# Patient Record
Sex: Male | Born: 1940 | Race: Black or African American | Hispanic: No | Marital: Married | Smoking: Former smoker
Health system: Southern US, Community
[De-identification: ages and names within clinical notes are randomized; demographics above are authoritative.]

## PROBLEM LIST (undated history)

## (undated) DIAGNOSIS — I255 Ischemic cardiomyopathy: Secondary | ICD-10-CM

## (undated) DIAGNOSIS — N189 Chronic kidney disease, unspecified: Secondary | ICD-10-CM

## (undated) DIAGNOSIS — E785 Hyperlipidemia, unspecified: Secondary | ICD-10-CM

## (undated) DIAGNOSIS — I2510 Atherosclerotic heart disease of native coronary artery without angina pectoris: Secondary | ICD-10-CM

## (undated) DIAGNOSIS — I10 Essential (primary) hypertension: Secondary | ICD-10-CM

## (undated) DIAGNOSIS — C801 Malignant (primary) neoplasm, unspecified: Secondary | ICD-10-CM

## (undated) HISTORY — DX: Hyperlipidemia, unspecified: E78.5

## (undated) HISTORY — DX: Ischemic cardiomyopathy: I25.5

## (undated) HISTORY — PX: DG ANGIO AV SHUNT*R*: HXRAD909

## (undated) HISTORY — DX: Atherosclerotic heart disease of native coronary artery without angina pectoris: I25.10

## (undated) HISTORY — PX: DIALYSIS FISTULA CREATION: SHX611

## (undated) HISTORY — DX: Essential (primary) hypertension: I10

## (undated) HISTORY — PX: OTHER SURGICAL HISTORY: SHX169

## (undated) HISTORY — PX: MITRAL VALVE REPAIR: SHX2039

## (undated) HISTORY — PX: SP AV DIALYSIS SHUNT INTRO NEEDLE *R*: HXRAD810

## (undated) HISTORY — DX: Chronic kidney disease, unspecified: N18.9

## (undated) HISTORY — DX: Malignant (primary) neoplasm, unspecified: C80.1

---

## 2000-06-09 ENCOUNTER — Other Ambulatory Visit: Admission: RE | Admit: 2000-06-09 | Discharge: 2000-06-09 | Payer: Self-pay | Admitting: Urology

## 2000-06-23 ENCOUNTER — Encounter: Admission: RE | Admit: 2000-06-23 | Discharge: 2000-06-23 | Payer: Self-pay | Admitting: Urology

## 2000-06-23 ENCOUNTER — Encounter: Payer: Self-pay | Admitting: Urology

## 2000-06-29 ENCOUNTER — Encounter: Admission: RE | Admit: 2000-06-29 | Discharge: 2000-09-27 | Payer: Self-pay | Admitting: *Deleted

## 2000-08-11 ENCOUNTER — Ambulatory Visit (HOSPITAL_BASED_OUTPATIENT_CLINIC_OR_DEPARTMENT_OTHER): Admission: RE | Admit: 2000-08-11 | Discharge: 2000-08-11 | Payer: Self-pay | Admitting: Urology

## 2000-08-11 ENCOUNTER — Encounter: Payer: Self-pay | Admitting: Urology

## 2001-01-03 ENCOUNTER — Ambulatory Visit: Admission: RE | Admit: 2001-01-03 | Discharge: 2001-04-03 | Payer: Self-pay | Admitting: *Deleted

## 2002-05-29 ENCOUNTER — Encounter: Payer: Self-pay | Admitting: Orthopedic Surgery

## 2002-06-01 ENCOUNTER — Ambulatory Visit (HOSPITAL_COMMUNITY): Admission: RE | Admit: 2002-06-01 | Discharge: 2002-06-01 | Payer: Self-pay | Admitting: Orthopedic Surgery

## 2002-08-02 ENCOUNTER — Encounter: Admission: RE | Admit: 2002-08-02 | Discharge: 2002-08-02 | Payer: Self-pay | Admitting: Nephrology

## 2002-08-02 ENCOUNTER — Encounter: Payer: Self-pay | Admitting: Nephrology

## 2003-08-16 ENCOUNTER — Inpatient Hospital Stay (HOSPITAL_COMMUNITY): Admission: EM | Admit: 2003-08-16 | Discharge: 2003-08-20 | Payer: Self-pay | Admitting: *Deleted

## 2003-08-19 ENCOUNTER — Encounter: Payer: Self-pay | Admitting: *Deleted

## 2003-08-19 ENCOUNTER — Encounter: Payer: Self-pay | Admitting: Internal Medicine

## 2004-05-24 HISTORY — PX: CORONARY ARTERY BYPASS GRAFT: SHX141

## 2004-08-19 ENCOUNTER — Ambulatory Visit: Payer: Self-pay | Admitting: Cardiology

## 2004-08-20 ENCOUNTER — Ambulatory Visit: Payer: Self-pay

## 2004-08-20 ENCOUNTER — Ambulatory Visit: Payer: Self-pay | Admitting: Cardiology

## 2004-08-21 ENCOUNTER — Ambulatory Visit: Payer: Self-pay

## 2004-08-24 ENCOUNTER — Inpatient Hospital Stay (HOSPITAL_COMMUNITY): Admission: AD | Admit: 2004-08-24 | Discharge: 2004-09-06 | Payer: Self-pay | Admitting: Cardiology

## 2004-08-24 ENCOUNTER — Ambulatory Visit: Payer: Self-pay | Admitting: Cardiology

## 2004-09-07 ENCOUNTER — Ambulatory Visit: Payer: Self-pay | Admitting: Cardiology

## 2004-09-14 ENCOUNTER — Ambulatory Visit: Payer: Self-pay | Admitting: Cardiology

## 2004-09-21 ENCOUNTER — Ambulatory Visit: Payer: Self-pay | Admitting: Cardiology

## 2004-09-28 ENCOUNTER — Ambulatory Visit: Payer: Self-pay | Admitting: *Deleted

## 2004-10-02 ENCOUNTER — Encounter: Admission: RE | Admit: 2004-10-02 | Discharge: 2004-10-02 | Payer: Self-pay | Admitting: Cardiothoracic Surgery

## 2004-10-12 ENCOUNTER — Ambulatory Visit: Payer: Self-pay | Admitting: Cardiology

## 2004-10-26 ENCOUNTER — Ambulatory Visit: Payer: Self-pay | Admitting: *Deleted

## 2004-10-27 ENCOUNTER — Inpatient Hospital Stay (HOSPITAL_COMMUNITY): Admission: EM | Admit: 2004-10-27 | Discharge: 2004-10-30 | Payer: Self-pay | Admitting: Emergency Medicine

## 2004-10-28 ENCOUNTER — Encounter: Payer: Self-pay | Admitting: Cardiovascular Disease

## 2004-10-28 ENCOUNTER — Ambulatory Visit: Payer: Self-pay | Admitting: Cardiovascular Disease

## 2004-11-05 ENCOUNTER — Inpatient Hospital Stay (HOSPITAL_COMMUNITY): Admission: RE | Admit: 2004-11-05 | Discharge: 2004-11-06 | Payer: Self-pay | Admitting: Internal Medicine

## 2004-11-11 ENCOUNTER — Ambulatory Visit: Payer: Self-pay | Admitting: Cardiology

## 2004-11-19 ENCOUNTER — Ambulatory Visit: Payer: Self-pay | Admitting: Cardiology

## 2004-11-23 ENCOUNTER — Ambulatory Visit: Payer: Self-pay | Admitting: Cardiology

## 2004-11-25 ENCOUNTER — Ambulatory Visit: Payer: Self-pay

## 2004-12-14 ENCOUNTER — Ambulatory Visit: Payer: Self-pay | Admitting: Cardiology

## 2004-12-17 ENCOUNTER — Ambulatory Visit: Payer: Self-pay | Admitting: Internal Medicine

## 2005-01-11 ENCOUNTER — Ambulatory Visit: Payer: Self-pay | Admitting: Cardiology

## 2005-02-08 ENCOUNTER — Ambulatory Visit: Payer: Self-pay | Admitting: Cardiology

## 2005-02-23 ENCOUNTER — Ambulatory Visit: Payer: Self-pay | Admitting: Internal Medicine

## 2005-03-16 ENCOUNTER — Ambulatory Visit: Payer: Self-pay | Admitting: Cardiology

## 2005-03-22 ENCOUNTER — Ambulatory Visit: Payer: Self-pay

## 2005-03-30 ENCOUNTER — Ambulatory Visit: Payer: Self-pay | Admitting: Cardiology

## 2005-04-06 ENCOUNTER — Ambulatory Visit: Payer: Self-pay | Admitting: Internal Medicine

## 2005-04-24 IMAGING — CR DG CHEST 1V PORT
1 series · 1 of 1 positions shown · non-contrast
Comparison: 05/29/02.

CLINICAL DATA: Chest pain and shortness of breath.
 PORTABLE CHEST, ONE VIEW 08/16/03 AT [DATE] HOURS

[view not recorded]
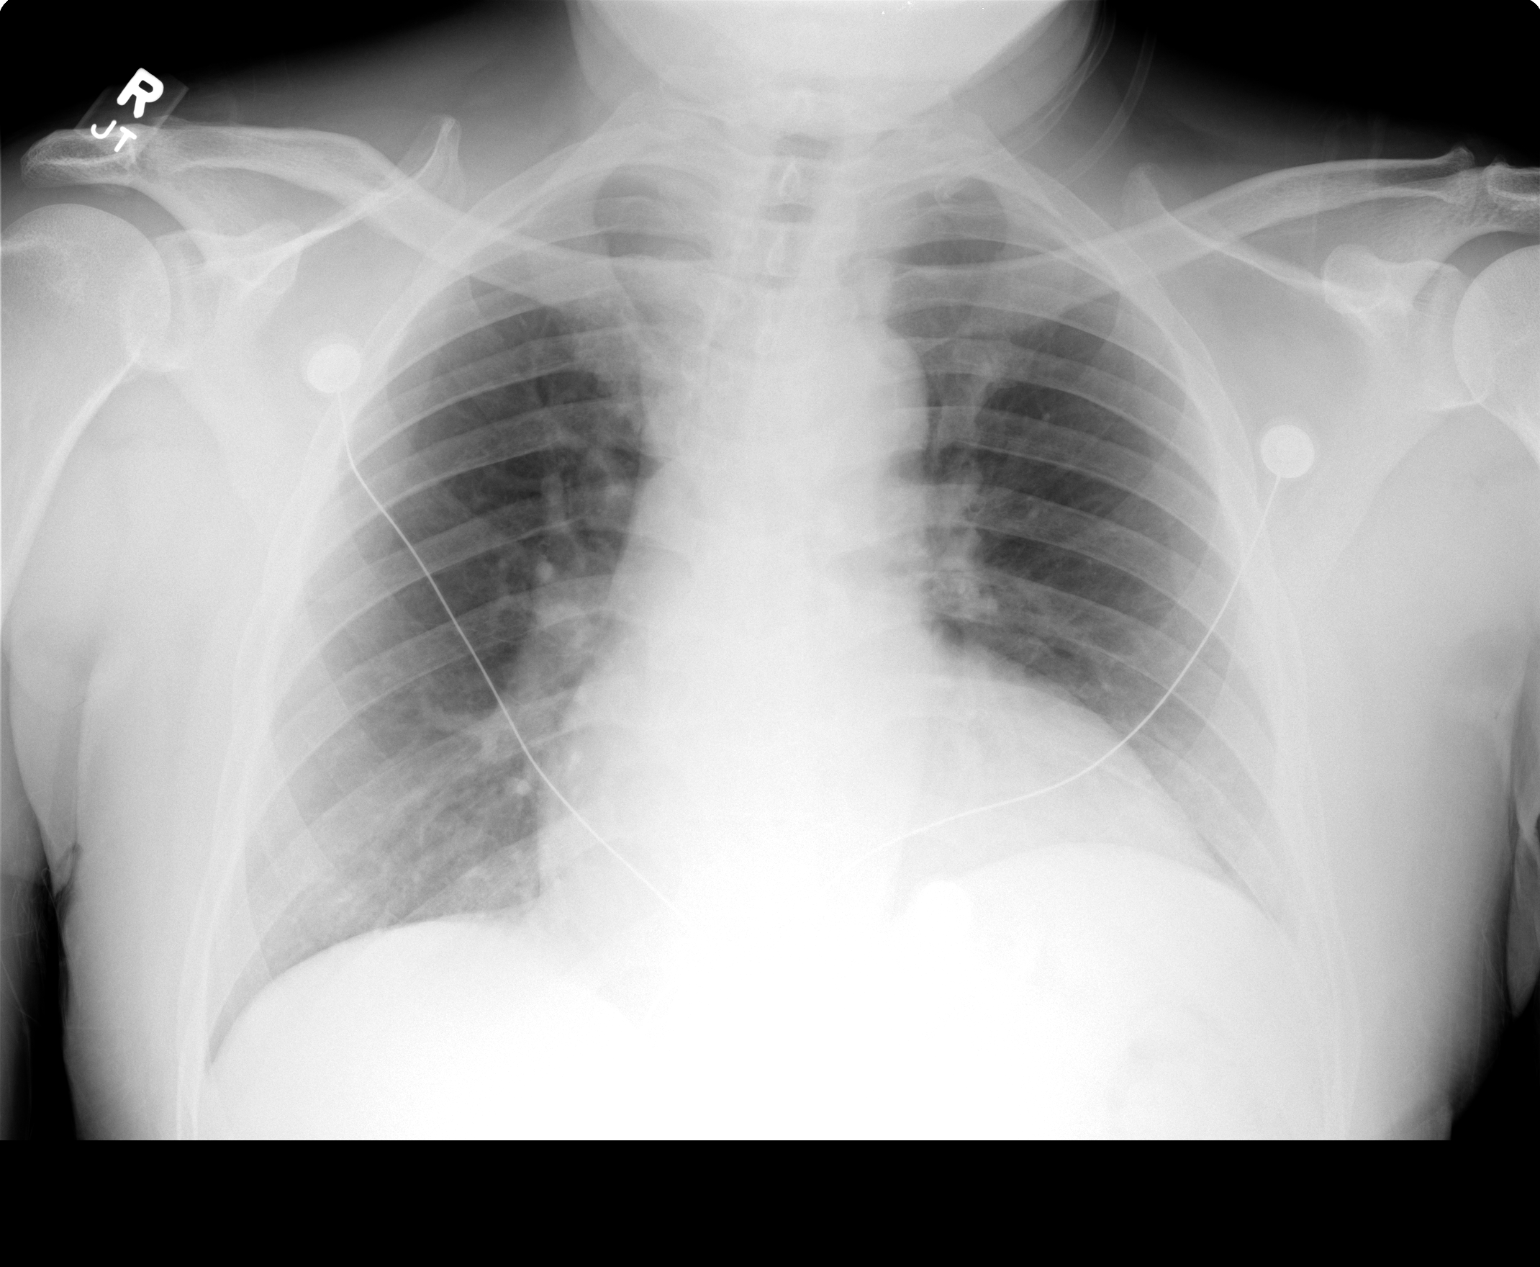

[1 of 1 positions shown; findings below may reference images not displayed]

Low inspiratory lung volumes are seen.  Heart size is within normal limits allowing for portable technique.  Both the lungs are clear.  
 IMPRESSION
 No active disease.

## 2005-06-01 ENCOUNTER — Ambulatory Visit: Payer: Self-pay | Admitting: Internal Medicine

## 2005-11-04 ENCOUNTER — Ambulatory Visit: Payer: Self-pay | Admitting: Cardiology

## 2005-11-16 ENCOUNTER — Encounter: Payer: Self-pay | Admitting: Cardiology

## 2005-11-16 ENCOUNTER — Ambulatory Visit: Payer: Self-pay

## 2005-11-29 ENCOUNTER — Ambulatory Visit (HOSPITAL_BASED_OUTPATIENT_CLINIC_OR_DEPARTMENT_OTHER): Admission: RE | Admit: 2005-11-29 | Discharge: 2005-11-29 | Payer: Self-pay | Admitting: Cardiology

## 2005-12-17 ENCOUNTER — Ambulatory Visit: Payer: Self-pay | Admitting: Pulmonary Disease

## 2006-02-01 ENCOUNTER — Ambulatory Visit: Payer: Self-pay | Admitting: Pulmonary Disease

## 2006-03-14 ENCOUNTER — Ambulatory Visit: Payer: Self-pay | Admitting: Pulmonary Disease

## 2006-05-10 ENCOUNTER — Ambulatory Visit: Payer: Self-pay | Admitting: Internal Medicine

## 2006-05-10 ENCOUNTER — Ambulatory Visit: Payer: Self-pay | Admitting: Cardiology

## 2006-06-11 IMAGING — CR DG CHEST 2V
2 series · 2 of 2 positions shown · non-contrast
Comparison: none

CLINICAL DATA: Post CABG.  Mitral regurgitation.  
 CHEST, TWO VIEWS: 
 Since [REDACTED] chest x-ray 09/04/04 there is stable moderate cardiomegaly post CABG with greater aeration at the lung bases with minimal left and small right pleural effusion with slight residual right basilar atelectasis.

[view not recorded (1 of 2)]
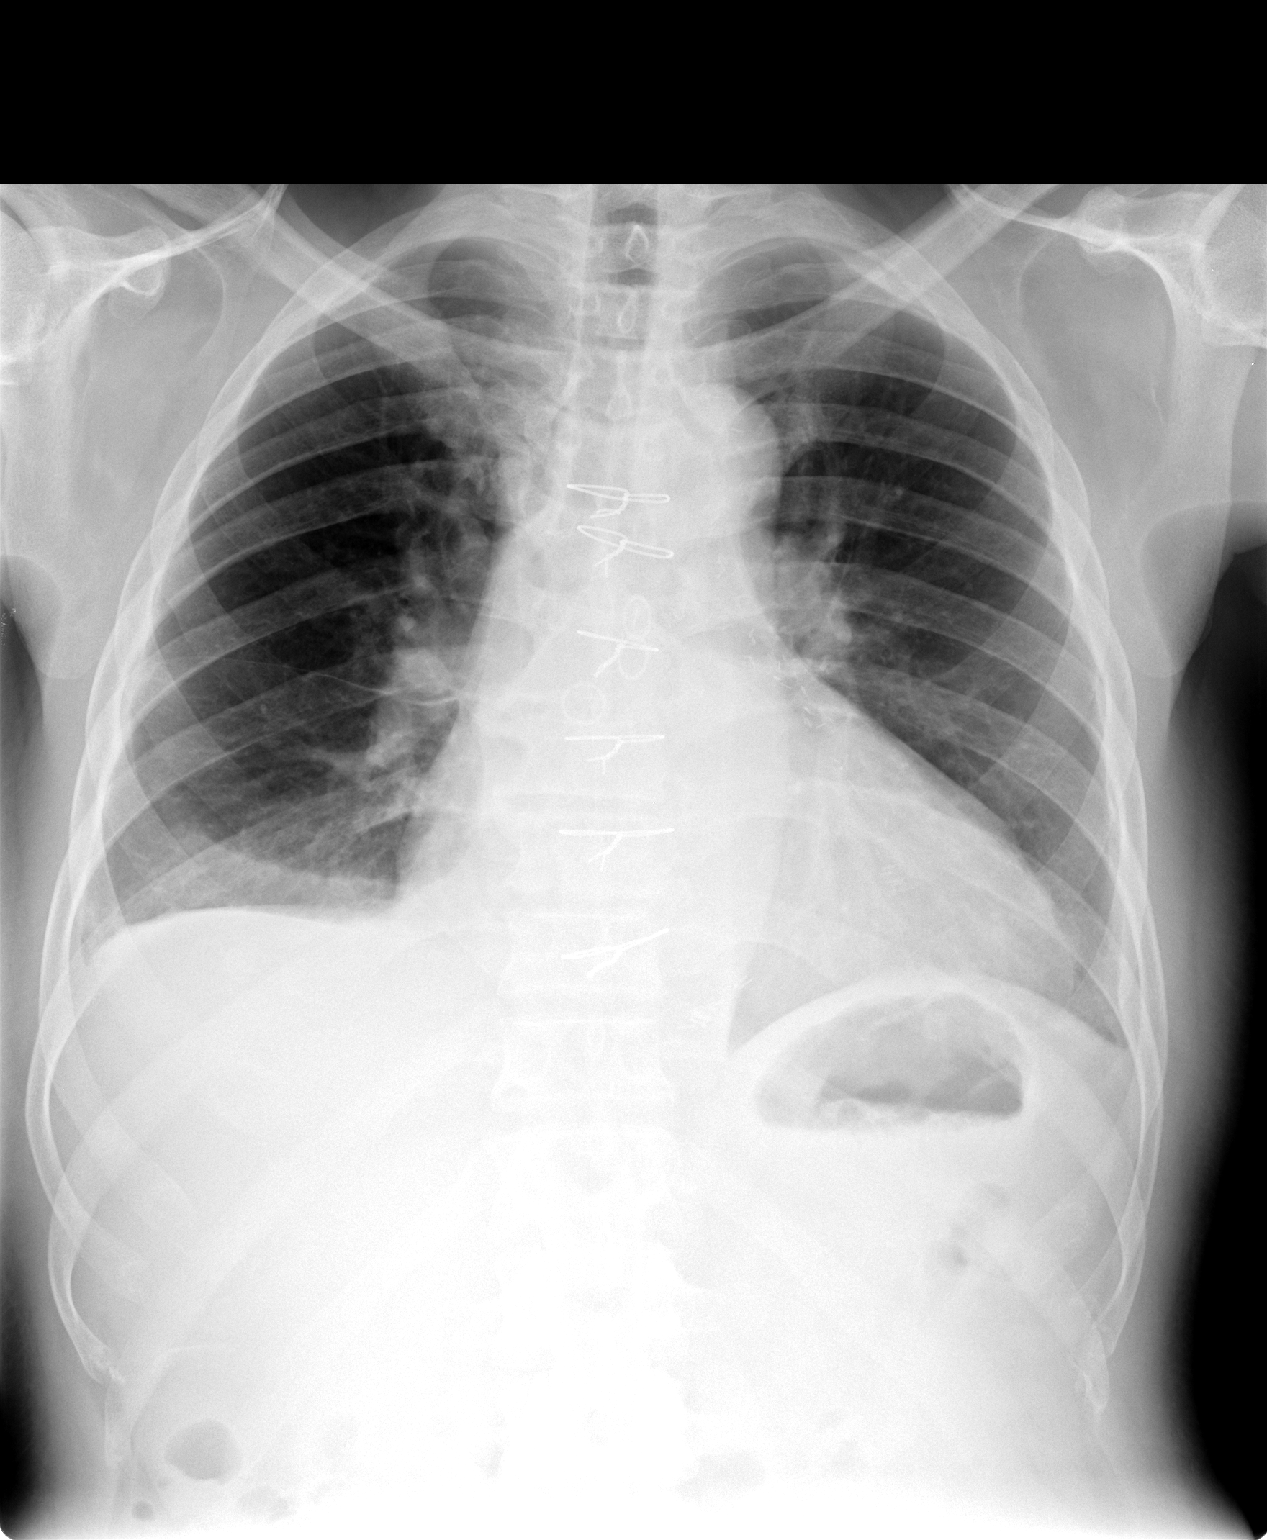

[view not recorded (2 of 2)]
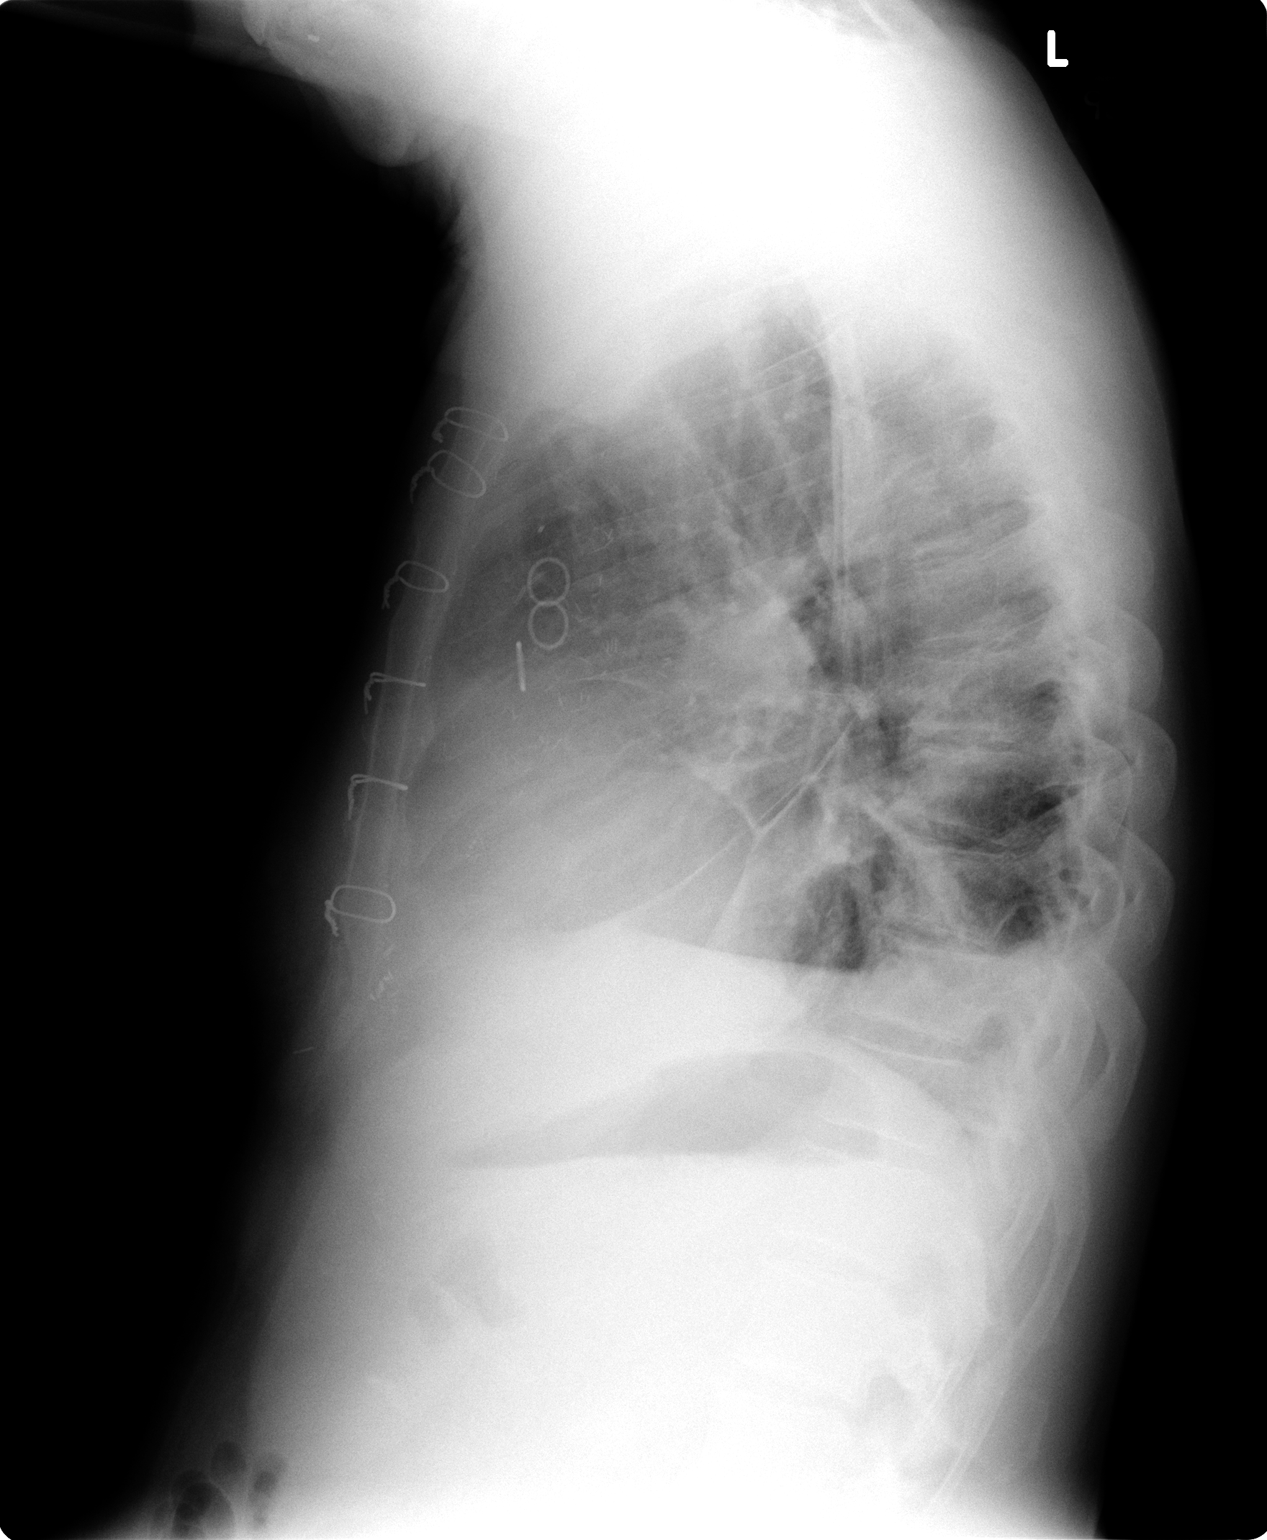

[2 of 2 positions shown; findings below may reference images not displayed]

IMPRESSION: Since [REDACTED] chest x-ray 09/04/04: 
 1.  Regressing small right greater than left pleural effusions with slight right basilar atelectasis. 
 2.  Stable moderate cardiomegaly post CABG.  
 3.  Otherwise no active disease.

## 2006-11-14 ENCOUNTER — Ambulatory Visit: Payer: Self-pay | Admitting: Cardiology

## 2007-03-13 ENCOUNTER — Ambulatory Visit: Payer: Self-pay | Admitting: Internal Medicine

## 2007-09-14 ENCOUNTER — Ambulatory Visit: Payer: Self-pay | Admitting: Cardiology

## 2007-12-08 ENCOUNTER — Ambulatory Visit: Payer: Self-pay | Admitting: Vascular Surgery

## 2007-12-15 ENCOUNTER — Ambulatory Visit: Payer: Self-pay | Admitting: Surgery

## 2007-12-15 ENCOUNTER — Ambulatory Visit (HOSPITAL_COMMUNITY): Admission: RE | Admit: 2007-12-15 | Discharge: 2007-12-15 | Payer: Self-pay | Admitting: Surgery

## 2007-12-24 ENCOUNTER — Ambulatory Visit: Payer: Self-pay | Admitting: Cardiovascular Disease

## 2007-12-25 ENCOUNTER — Encounter: Payer: Self-pay | Admitting: Cardiovascular Disease

## 2007-12-25 ENCOUNTER — Inpatient Hospital Stay (HOSPITAL_COMMUNITY): Admission: EM | Admit: 2007-12-25 | Discharge: 2008-01-02 | Payer: Self-pay | Admitting: Emergency Medicine

## 2007-12-28 ENCOUNTER — Encounter: Payer: Self-pay | Admitting: Cardiology

## 2008-01-15 ENCOUNTER — Ambulatory Visit: Payer: Self-pay | Admitting: Surgery

## 2008-01-17 ENCOUNTER — Ambulatory Visit (HOSPITAL_COMMUNITY): Admission: RE | Admit: 2008-01-17 | Discharge: 2008-01-17 | Payer: Self-pay | Admitting: Surgery

## 2008-01-17 ENCOUNTER — Ambulatory Visit: Payer: Self-pay | Admitting: Vascular Surgery

## 2008-02-05 ENCOUNTER — Ambulatory Visit: Payer: Self-pay | Admitting: Cardiology

## 2008-02-26 ENCOUNTER — Ambulatory Visit: Payer: Self-pay | Admitting: Surgery

## 2008-05-06 ENCOUNTER — Ambulatory Visit: Payer: Self-pay | Admitting: Cardiology

## 2008-05-10 ENCOUNTER — Ambulatory Visit (HOSPITAL_COMMUNITY): Admission: RE | Admit: 2008-05-10 | Discharge: 2008-05-10 | Payer: Self-pay | Admitting: Nephrology

## 2008-05-20 ENCOUNTER — Ambulatory Visit: Payer: Self-pay | Admitting: Surgery

## 2008-05-31 ENCOUNTER — Ambulatory Visit (HOSPITAL_COMMUNITY): Admission: RE | Admit: 2008-05-31 | Discharge: 2008-05-31 | Payer: Self-pay | Admitting: Surgery

## 2008-05-31 ENCOUNTER — Ambulatory Visit: Payer: Self-pay | Admitting: Surgery

## 2008-06-27 ENCOUNTER — Encounter: Payer: Self-pay | Admitting: Internal Medicine

## 2008-07-15 ENCOUNTER — Ambulatory Visit: Payer: Self-pay | Admitting: Surgery

## 2008-08-12 ENCOUNTER — Ambulatory Visit: Payer: Self-pay | Admitting: *Deleted

## 2008-08-12 ENCOUNTER — Ambulatory Visit (HOSPITAL_COMMUNITY): Admission: RE | Admit: 2008-08-12 | Discharge: 2008-08-12 | Payer: Self-pay | Admitting: Vascular Surgery

## 2008-08-30 DIAGNOSIS — M109 Gout, unspecified: Secondary | ICD-10-CM

## 2008-08-30 DIAGNOSIS — Z8546 Personal history of malignant neoplasm of prostate: Secondary | ICD-10-CM

## 2008-08-30 DIAGNOSIS — Z95810 Presence of automatic (implantable) cardiac defibrillator: Secondary | ICD-10-CM | POA: Insufficient documentation

## 2008-08-30 DIAGNOSIS — I25810 Atherosclerosis of coronary artery bypass graft(s) without angina pectoris: Secondary | ICD-10-CM

## 2008-08-30 DIAGNOSIS — I10 Essential (primary) hypertension: Secondary | ICD-10-CM

## 2008-09-02 ENCOUNTER — Ambulatory Visit: Payer: Self-pay | Admitting: Internal Medicine

## 2008-09-02 ENCOUNTER — Encounter: Payer: Self-pay | Admitting: Internal Medicine

## 2009-04-28 ENCOUNTER — Encounter (INDEPENDENT_AMBULATORY_CARE_PROVIDER_SITE_OTHER): Payer: Self-pay | Admitting: *Deleted

## 2009-06-05 ENCOUNTER — Telehealth (INDEPENDENT_AMBULATORY_CARE_PROVIDER_SITE_OTHER): Payer: Self-pay | Admitting: *Deleted

## 2009-07-02 ENCOUNTER — Telehealth (INDEPENDENT_AMBULATORY_CARE_PROVIDER_SITE_OTHER): Payer: Self-pay

## 2009-07-03 ENCOUNTER — Ambulatory Visit: Payer: Self-pay | Admitting: Cardiology

## 2009-07-03 ENCOUNTER — Ambulatory Visit (HOSPITAL_COMMUNITY): Admission: RE | Admit: 2009-07-03 | Discharge: 2009-07-03 | Payer: Self-pay | Admitting: Surgery

## 2009-07-03 ENCOUNTER — Ambulatory Visit: Payer: Self-pay

## 2009-07-03 ENCOUNTER — Encounter: Payer: Self-pay | Admitting: Cardiology

## 2009-07-16 ENCOUNTER — Encounter: Payer: Self-pay | Admitting: Cardiology

## 2009-07-17 ENCOUNTER — Encounter: Payer: Self-pay | Admitting: Internal Medicine

## 2009-07-17 ENCOUNTER — Ambulatory Visit: Payer: Self-pay | Admitting: Cardiology

## 2009-09-01 IMAGING — CR DG CHEST 1V PORT
1 series · 1 of 1 positions shown · non-contrast
Comparison: 12/15/2007

CLINICAL DATA: Cough, short of breath, rales

PORTABLE CHEST - 1 VIEW

[AP]
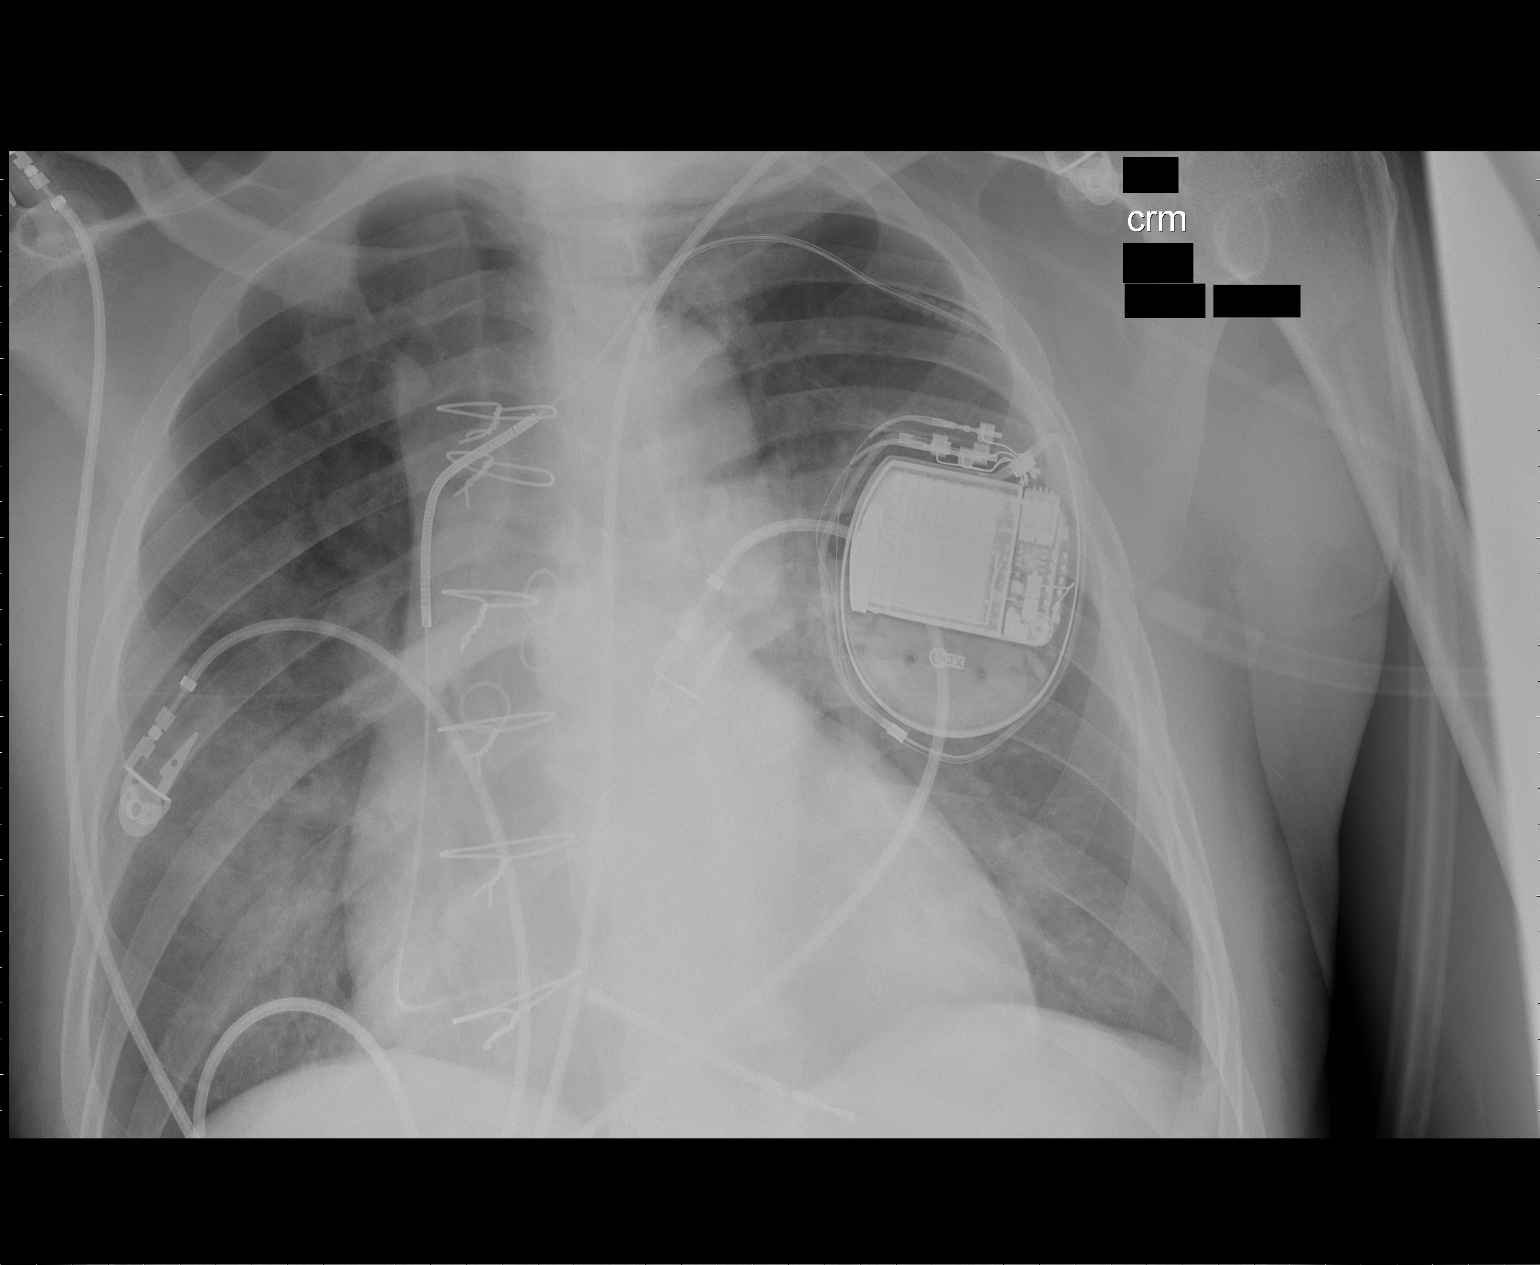

[1 of 1 positions shown; findings below may reference images not displayed]

FINDINGS: Cardiomegaly.  Left subclavian cardiac pacemaker.
Numerous monitoring leads are projected over the chest.  There is
bilateral perihilar airspace opacity consistent with pulmonary
edema.  This is mild to moderate.  No definite right pleural
effusion although the extreme right costophrenic angle is excluded
from view.  Small left pleural effusion.
IMPRESSION: Moderately severe congestive heart failure.

## 2009-09-03 IMAGING — CR DG CHEST 1V PORT
1 series · 1 of 1 positions shown · non-contrast
Comparison: 12/24/2007.

CLINICAL DATA: Congestive heart failure, end-stage renal disease.

PORTABLE CHEST - 1 VIEW

[AP]
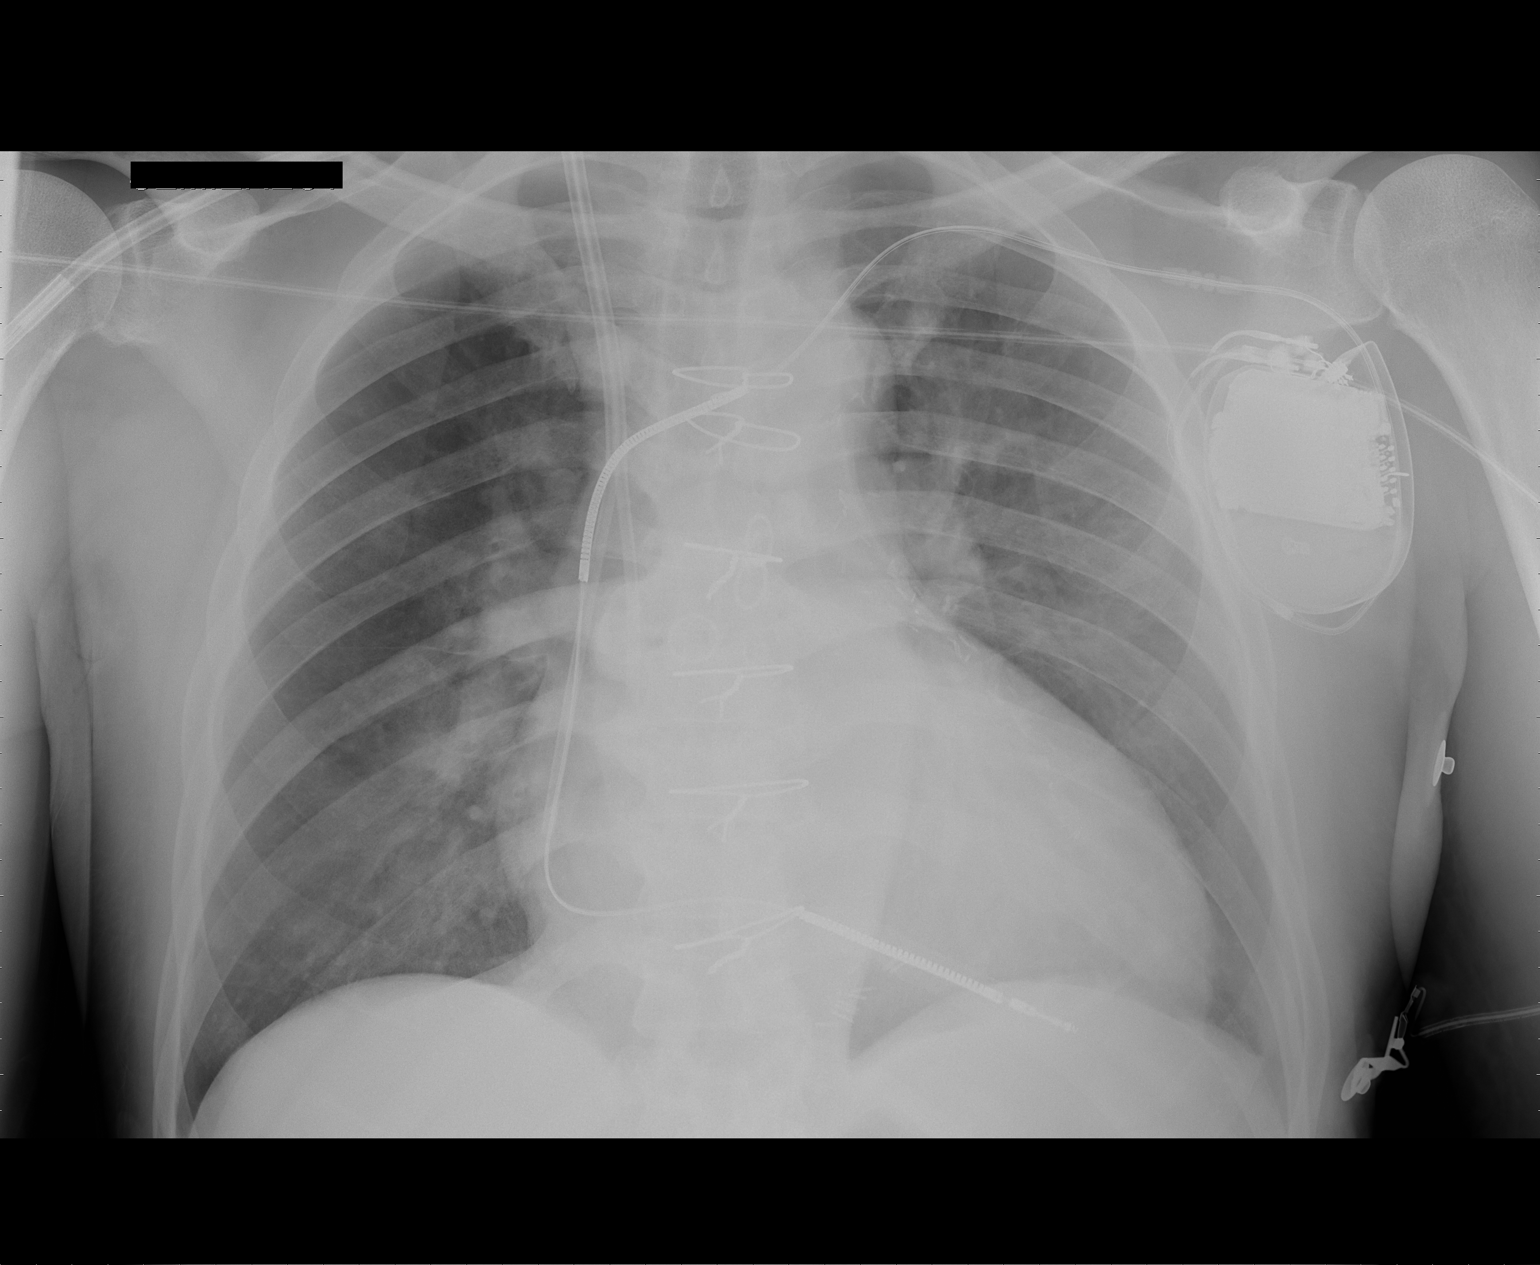

[1 of 1 positions shown; findings below may reference images not displayed]

FINDINGS: There is cardiomegaly.    The previously seen congestive
changes have mildly improved.  A central venous hemodialysis
catheter has been placed via the right internal jugular venous
route with  tips of the catheter within the superior vena cava.
There is no pneumothorax.  There is a pacer/defibrillator present
with right ventricular lead which appears in satisfactory position.
There has been a previous median sternotomy.
IMPRESSION: Satisfactory placement of central venous hemodialysis catheter as
discussed above.  No pneumothorax.  Mild improvement in vascular
congestive changes.

## 2009-10-07 ENCOUNTER — Ambulatory Visit (HOSPITAL_COMMUNITY): Admission: RE | Admit: 2009-10-07 | Discharge: 2009-10-07 | Payer: Self-pay | Admitting: Nephrology

## 2009-11-19 ENCOUNTER — Telehealth: Payer: Self-pay | Admitting: Cardiology

## 2010-01-13 ENCOUNTER — Ambulatory Visit: Payer: Self-pay | Admitting: Cardiology

## 2010-01-13 DIAGNOSIS — I2589 Other forms of chronic ischemic heart disease: Secondary | ICD-10-CM

## 2010-01-13 DIAGNOSIS — E785 Hyperlipidemia, unspecified: Secondary | ICD-10-CM | POA: Insufficient documentation

## 2010-01-15 ENCOUNTER — Ambulatory Visit: Payer: Self-pay | Admitting: Internal Medicine

## 2010-01-15 ENCOUNTER — Telehealth (INDEPENDENT_AMBULATORY_CARE_PROVIDER_SITE_OTHER): Payer: Self-pay | Admitting: *Deleted

## 2010-04-16 ENCOUNTER — Ambulatory Visit: Payer: Self-pay | Admitting: Internal Medicine

## 2010-04-20 ENCOUNTER — Encounter: Payer: Self-pay | Admitting: Internal Medicine

## 2010-04-28 ENCOUNTER — Encounter: Payer: Self-pay | Admitting: Internal Medicine

## 2010-05-08 ENCOUNTER — Encounter: Payer: Self-pay | Admitting: Internal Medicine

## 2010-06-11 ENCOUNTER — Other Ambulatory Visit (HOSPITAL_COMMUNITY): Payer: Self-pay | Admitting: Nephrology

## 2010-06-11 DIAGNOSIS — N186 End stage renal disease: Secondary | ICD-10-CM

## 2010-06-17 ENCOUNTER — Telehealth (INDEPENDENT_AMBULATORY_CARE_PROVIDER_SITE_OTHER): Payer: Self-pay | Admitting: *Deleted

## 2010-06-23 NOTE — Progress Notes (Signed)
   Phone Note Air cabin crew of Call: PT WAS ENROLLED IN [place].  FIRST TRANSMISSION SCHEDULED FOR 04-16-10.  PT AWARE TO SEND TRANSMISSION. Vella Kohler  January 15, 2010 12:58 PM

## 2010-06-23 NOTE — Progress Notes (Signed)
   Recieved pt ROI faxed all Recent Records over to [facility] to fax [phone] [facility]  June 05, 2009 10:01 AM

## 2010-06-23 NOTE — Procedures (Signed)
Summary: Cardiology Device Clinic    Allergies: No Known Drug Allergies   ICD Specifications Following MD:  Sherryl Manges, MD     Referring MD:  [facility] ICD Vendor:  St Jude     ICD Model Number:  [phone]     ICD Serial Number:  [id number] ICD DOI:  11/05/2004     ICD Implanting MD:  Sherryl Manges, MD  Lead 1:    Location: RV     DOI: 11/05/2004     Model #: 7001     Serial #: [id number]     Status: active  Indications::  ICM   ICD Follow Up Remote Check?  No Battery Voltage:  2.82 V     Charge Time:  10.1 seconds     Underlying rhythm:  SR ICD Dependent:  No       ICD Device Measurements Right Ventricle:  Amplitude: 12 mV, Impedance: 410 ohms, Threshold: 1.0 V at 0.5 msec Shock Impedance: 30 ohms   Episodes Shock:  0     ATP:  0     Nonsustained:  0     Ventricular Pacing:  <1%  Brady Parameters Mode VVI     Lower Rate Limit:  40      Tachy Zones VF:  240     VT:  211     VT1:  171     Next Cardiology Appt Due:  12/22/2009 Tech Comments:  Normal device function.  No changes made today.  Pt will only agree to be checked every 6 months.  ROV 6 months SK. Gypsy Balsam RN BSN  July 17, 2009 12:47 PM

## 2010-06-23 NOTE — Cardiovascular Report (Signed)
Summary: Office Visit   Office Visit   Imported By: Roderic Ovens 01/16/2010 10:21:36  _____________________________________________________________________  External Attachment:    Type:   Image     Comment:   External Document

## 2010-06-23 NOTE — Letter (Signed)
Summary: Device-Delinquent Phone Journalist, newspaper, Main Office  [address]   [place], [place] [address]   Phone: [phone]  Fax: [phone]     April 20, 2010 MRN: [id number]   [facility] [place] [address] [place], [place]  [address]   Dear Mr. Goold,  According to our records, you were scheduled for a device phone transmission on 04-16-2010.     We did not receive any results from this check.  If you transmitted on your scheduled day, please call us to help troubleshoot your system.  If you forgot to send your transmission, please send one upon receipt of this letter.  Thank you,   Architectural technologist Device Clinic

## 2010-06-23 NOTE — Assessment & Plan Note (Signed)
Summary: 6 MON ROV PER AMBER      Allergies Added: NKDA  Primary Provider:  Dr. Dagoberto Ligas  CC:  6 month rov.  Marland Kitchen  History of Present Illness: [name] [name] is seen in followup for an ICD implanted her primary prevention of ischemic heart disease.   He has had no shortness of breath, PND or orthopnea. He has had no weight gain or edema.  Current Medications (verified): 1)  Allopurinol 100 Mg Tabs (Allopurinol) .... As Needed 2)  Glipizide 2.5 Mg Xr24h-Tab (Glipizide) .Marland Kitchen.. 1 Tab Once Daily 3)  Felodipine 10 Mg Xr24h-Tab (Felodipine) .... Once Daily 4)  Aspirin Adult Low Strength 81 Mg Tbec (Aspirin) .... Once Daily 5)  Crestor 40 Mg Tabs (Rosuvastatin Calcium) .... Once Daily 6)  Toprol Xl 50 Mg Xr24h-Tab (Metoprolol Succinate) .... At Bedtime 7)  Phoslo 667 Mg Caps (Calcium Acetate (Phos Binder)) .... 2 Tabs Three Times A Day 8)  Nephro-Vite .Marland Kitchen.. 1 Tab Daily 9)  Colchicine 0.6 Mg Tabs (Colchicine) .... As Needed 10)  Hectrol .... 1mg  With Iv/hd  Allergies (verified): No Known Drug Allergies  Past History:  Past Medical History: Last updated: 01/13/2010 PROSTATE CANCER-PERSONAL HISTORY (ICD-V10.46) HYPERTENSION, BENIGN ESSENTIAL (ICD-401.1) GOUT (ICD-274.9) CAD Ischemic myopathy (he has been 10-20% in the past, but was most recently 40-45%). Chronic kidney disease, dialysis dependant. Diabetes mellitus. Prostate cancer status post radiation. Dyslipidemia.  Past Surgical History: Last updated: Feb 06, 2010 ICD (placed on November 05, 2004, St. Jude single-chamber Atlas [phone]) CABG (2006 with a LIMA to the LAD, vein graft to first obtuse marginal, vein graft to the PDA, and vein graft to the diagonal). Mitral valve repair with a #28 annuloplasty ring. Dialysis fistula Angio A/V Shunt A/V Dialysis Shunt Intro Needle *R*  Family History: Last updated: 02/06/10 Mother died of an MI at age 68.  Father died at age 18 of lung cancer.  The patient has one sister with  hypertension.  Social History: Last updated: 08/30/2008 Retired -Emergency planning/management officer Married  Tobacco Use - Former. --quit 1987, 15ppy Alcohol Use - no Drug Use - no  Vital Signs:  Patient profile:   70 year old male Height:      66 inches Weight:      141 pounds BMI:     22.84 Pulse rate:   74 / minute Pulse rhythm:   regular BP sitting:   142 / 74  (left arm) Cuff size:   regular  Vitals Entered By: Judithe Modest CMA (January 15, 2010 11:17 AM)  Physical Exam  General:  The patient was alert and oriented in no acute distress. HEENT Normal.  Neck veins were flat, carotids were brisk.  Lungs were clear.  Heart sounds were regular without murmurs or gallops.  Abdomen was soft with active bowel sounds. There is no clubbing cyanosis or edema. Skin Warm and dry     ICD Specifications Following MD:  Sherryl Manges, MD     Referring MD:  [facility] ICD Vendor:  St Jude     ICD Model Number:  [phone]     ICD Serial Number:  [id number] ICD DOI:  11/05/2004     ICD Implanting MD:  Sherryl Manges, MD  Lead 1:    Location: RV     DOI: 11/05/2004     Model #: 7001     Serial #: [id number]     Status: active  Indications::  ICM   ICD Follow Up Battery Voltage:  2.64 V     Charge  Time:  10.9 seconds     Underlying rhythm:  SR ICD Dependent:  No       ICD Device Measurements Right Ventricle:  Amplitude: 12.0 mV, Impedance: 400 ohms, Threshold: 0.75 V at 0.5 msec Shock Impedance: 30 ohms   Episodes MS Episodes:  0     Shock:  0     ATP:  0     Nonsustained:  0     Atrial Therapies:  0 Ventricular Pacing:  <1%  Brady Parameters Mode VVI     Lower Rate Limit:  40      Tachy Zones VF:  240     VT:  211     VT1:  171     Next Cardiology Appt Due:  06/24/2010 Tech Comments:  NORMAL DEVICE FUNCTION.  NO EPISODES SINCE LAST CHECK.  NO CHANGES MADE. ROV IN 6 MTHS W/DEVICE CLINIC. 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 Once daily    Aspirin Adult Low Strength 81 Mg Tbec (Aspirin) ..... Once daily    Toprol Xl 50 Mg Xr24h-tab (Metoprolol succinate) .Marland Kitchen... At bedtime  Problem # 2:  AUTOMATIC IMPLANTABLE CARDIAC DEFIBRILLATOR SITU (ICD-V45.02) Device parameters and data were reviewed and no changes were made  will set him up for remote followup

## 2010-06-23 NOTE — Progress Notes (Signed)
Summary: Dobutamine Echo Pre-Procedure  Phone Note Outgoing Call Call back at Home Phone [phone]   Call placed by: Irean Hong, RN,  July 02, 2009 9:01 AM Summary of Call: Reviewed information for Dobutamine Echo. The patient to hold toprol x 24 hrs. prior test.  Spoke with patient's wife. Romyn Boswell,RN.

## 2010-06-23 NOTE — Assessment & Plan Note (Signed)
Summary: f1y/pacer check/pt needs clearance for kidney transplant/jml  Medications Added * HECTROL 1mg  with IV/HD      Allergies Added: NKDA  Visit Type:  Follow-up Primary Provider:  Dr. Dagoberto Ligas  CC:  CAD/Cardiomyopathy.  History of Present Illness: The patient presents for followup of the above. It has been one year since I last saw him. He says that he has been doing well. He is tolerating dialysis. He denies any chest pressure, neck or arm discomfort. He has no shortness of breath and denies any PND or orthopnea. He has no palpitations, presyncope or syncope. Of note he is being considered for kidney transplant and is here as well for preoperative clearance.  The patient did have a dobutamine echocardiogram.  This was done on 210. It demonstrated the EF to be about 35-40% which is where it has been previously. With peak stress all areas had improvement in wall thickness except the basal posterior and basal to mid inferior walls which remained akinetic suggesting scar. There was no obvious evidence of ischemia.  Current Medications (verified): 1)  Allopurinol 100 Mg Tabs (Allopurinol) .Marland Kitchen.. 1 Tab Two Times A Day Verify Dosage 2)  Glipizide 2.5 Mg Xr24h-Tab (Glipizide) .Marland Kitchen.. 1 Tab Once Daily 3)  Felodipine 10 Mg Xr24h-Tab (Felodipine) .... Once Daily 4)  Aspirin Adult Low Strength 81 Mg Tbec (Aspirin) .... Once Daily 5)  Crestor 40 Mg Tabs (Rosuvastatin Calcium) .... Once Daily 6)  Toprol Xl 50 Mg Xr24h-Tab (Metoprolol Succinate) .... At Bedtime 7)  Phoslo 667 Mg Caps (Calcium Acetate (Phos Binder)) .... 2 Tabs Three Times A Day 8)  Nephro-Vite .Marland Kitchen.. 1 Tab Daily 9)  Colchicine 0.6 Mg Tabs (Colchicine) .... Two Times A Day 10)  Hectrol .... 1mg  With Iv/hd  Allergies (verified): No Known Drug Allergies  Past History:  Past Medical History: PROSTATE CANCER-PERSONAL HISTORY (ICD-V10.46) HYPERTENSION, BENIGN ESSENTIAL (ICD-401.1) GOUT (ICD-274.9) CAD Ischemic myopathy (he has been  10-20% in the past, but was most recently 40-45%). Chronic kidney disease, dialysis dependant. Diabetes mellitus. Prostate cancer status post radiation. Dyslipidemia. IICD (placed on November 05, 2004, St. Jude single-chamber Atlas [phone]).   Past Surgical History: ICD (placed on November 05, 2004, St. Jude single-chamber Atlas [phone]) CABG (2006 with a LIMA to the LAD, vein graft to first obtuse marginal, vein graft to the PDA, and vein graft to the diagonal). Mitral valve repair with a #28 annuloplasty ring. Dialysis fistula  Review of Systems       As stated in the HPI and negative for all other systems.   Vital Signs:  Patient profile:   70 year old male Height:      66 inches Weight:      142 pounds BMI:     23.00 Pulse rate:   68 / minute Resp:     16 per minute BP sitting:   142 / 72  (left arm)  Vitals Entered By: Marrion Coy, CNA (July 17, 2009 11:44 AM)  Physical Exam  General:  Well developed, well nourished, in no acute distress. Head:  normocephalic and atraumatic Eyes:  PERRLA/EOM intact; conjunctiva and lids normal. Mouth:  Oral mucosa normal. Neck:  Neck supple, no JVD. No masses, thyromegaly or abnormal cervical nodes. Chest Wall:  Well healed ICD and sternotomy scars Lungs:  Clear bilaterally to auscultation and percussion. Abdomen:  Bowel sounds positive; abdomen soft and non-tender without masses, organomegaly, or hernias noted. No hepatosplenomegaly. Msk:  Back normal, normal gait. Muscle strength and tone normal. Extremities:  right upper arm dialysis graft and fistula with thrill and bruit Neurologic:  Alert and oriented x 3. Skin:  Intact without lesions or rashes. Cervical Nodes:  no significant adenopathy Axillary Nodes:  no significant adenopathy Inguinal Nodes:  no significant adenopathy Psych:  Normal affect.   Detailed Cardiovascular Exam  Neck    Carotids: Carotids full and equal bilaterally without bruits.      Neck Veins: Normal, no JVD.     Heart    Inspection: no deformities or lifts noted.      Palpation: normal PMI with no thrills palpable.      Auscultation: regular rate and rhythm, S1, S2 without murmurs, rubs, gallops, or clicks.    Vascular    Abdominal Aorta: no palpable masses, pulsations, or audible bruits.      Femoral Pulses: normal femoral pulses bilaterally.      Pedal Pulses: normal pedal pulses bilaterally.      Radial Pulses: normal radial pulses bilaterally.      Peripheral Circulation: no clubbing, cyanosis, or edema noted with normal capillary refill.     EKG  Procedure date:  07/17/2009  Findings:      sinus rhythm, rate 68, left ventricular hypertrophy minimal criteria with repolarization changes, right axis deviation   ICD Specifications Following MD:  Sherryl Manges, MD     Referring MD:  [facility] ICD Vendor:  St Jude     ICD Model Number:  [id number]     ICD Serial Number:  [id number] ICD DOI:  11/05/2004     ICD Implanting MD:  Sherryl Manges, MD  Lead 1:    Location: RV     DOI: 11/05/2004     Model #: 7001     Serial #: [id number]     Status: active  Indications::  ICM   ICD Follow Up ICD Dependent:  No      Brady Parameters Mode VVI     Lower Rate Limit:  40      Tachy Zones VF:  240     VT:  211     VT1:  171     Impression & Recommendations:  Problem # 1:  PREOPERATIVE EXAMINATION (ICD-V72.84) Based on my review of the stress test and this interview and examination of the patient he will be at acceptable risk for renal transplant. Certainly there is cardiovascular risk particularly for volume overload given his reduced ejection fraction. However, this is stable and he is quite well compensated with volume management at dialysis. At this point no further cardiovascular testing would be suggested. I would suggest careful cardiac monitoring and followup of volume status with any surgery.  Problem # 2:  HYPERTENSION, BENIGN ESSENTIAL (ICD-401.1) His blood pressure is very mildly elevated.  However, this is apparently unusual. He says it is well-controlled at dialysis. No further testing or change in therapies is suggested.  Problem # 3:  AUTOMATIC IMPLANTABLE CARDIAC DEFIBRILLATOR SITU (ICD-V45.02) He had this interrogated today and is functioning normally. He will have routine followup in our EP clinic.  Problem # 4:  CAD, ARTERY BYPASS GRAFT (ICD-414.04) He will have continued risk reduction.  Appended Document: f1y/pacer check/pt needs clearance for kidney transplant/jml FAXED AS REQUESTED

## 2010-06-23 NOTE — Miscellaneous (Signed)
Clinical Lists Changes  Observations: Added new observation of ECHOINTERP: - Left ventricle: The cavity size was normal. Wall thickness was       normal. Systolic function was moderately reduced. The estimated       ejection fraction was in the range of 35% to 40%. Moderate global       hypokinesis with akinesis of the basal to mid inferior and basal       posterior walls. Features are consistent with a pseudonormal left       ventricular filling pattern, with concomitant abnormal relaxation       and increased filling pressure (grade 2 diastolic dysfunction).     - Aortic valve: There was no stenosis.     - Mitral valve: Patient is status post mitral valve repair. Mild       regurgitation. Probably mild mitral stenosis. Mean gradient is 8       mmHg but pressure half-time is only 89 msec. Pressure half-time:       89ms. Mean gradient: 8mm Hg (D).     - Left atrium: The atrium was moderately dilated.     - Pulmonary veins: Systolic blunting of the pulmonary vein doppler       signal.     - Right ventricle: The cavity size was normal. Pacer wire or       catheter noted in right ventricle. Systolic function was normal.     - Tricuspid valve: Mild-moderate regurgitation.     - Pulmonary arteries: PA peak pressure: 43mm Hg (S).     - Inferior vena cava: The vessel was normal in size ; the       respirophasic diameter changes were in the normal range (= 50%);       findings are consistent with normal central venous pressure.     Impressions:            - Normal LV size with moderate global hypokinesis, EF 35-40%       (perhaps closer to 40%). The basal to mid inferior and basal       posterior walls were akinetic. Moderate diastolic dysfunction. The       patient is status post mitral valve repair with mild mitral       regurgitation and mild mitral stenosis. The RV appeared normal in       size and systolic function. Mild pulmonary hypertension.        (07/03/2009  10:58)      Echocardiogram  Procedure date:  07/03/2009  Findings:      - Left ventricle: The cavity size was normal. Wall thickness was       normal. Systolic function was moderately reduced. The estimated       ejection fraction was in the range of 35% to 40%. Moderate global       hypokinesis with akinesis of the basal to mid inferior and basal       posterior walls. Features are consistent with a pseudonormal left       ventricular filling pattern, with concomitant abnormal relaxation       and increased filling pressure (grade 2 diastolic dysfunction).     - Aortic valve: There was no stenosis.     - Mitral valve: Patient is status post mitral valve repair. Mild       regurgitation. Probably mild mitral stenosis. Mean gradient is 8       mmHg but pressure half-time is only 89 msec. Pressure  half-time:       89ms. Mean gradient: 8mm Hg (D).     - Left atrium: The atrium was moderately dilated.     - Pulmonary veins: Systolic blunting of the pulmonary vein doppler       signal.     - Right ventricle: The cavity size was normal. Pacer wire or       catheter noted in right ventricle. Systolic function was normal.     - Tricuspid valve: Mild-moderate regurgitation.     - Pulmonary arteries: PA peak pressure: 43mm Hg (S).     - Inferior vena cava: The vessel was normal in size ; the       respirophasic diameter changes were in the normal range (= 50%);       findings are consistent with normal central venous pressure.     Impressions:            - Normal LV size with moderate global hypokinesis, EF 35-40%       (perhaps closer to 40%). The basal to mid inferior and basal       posterior walls were akinetic. Moderate diastolic dysfunction. The       patient is status post mitral valve repair with mild mitral       regurgitation and mild mitral stenosis. The RV appeared normal in       size and systolic function. Mild pulmonary hypertension.

## 2010-06-23 NOTE — Progress Notes (Signed)
Summary: Question about medications   Phone Note Call from Patient Call back at Home Phone [phone]   Caller: Spouse/ linda Summary of Call: Pt wife calling regarding medication Zocor and Crestor Initial call taken by: Judie Grieve,  November 19, 2009 8:48 AM  Follow-up for Phone Call        Called patient's wife back. She states that he was seen at the [place] and they are requesting documentation of why he was changed from Lipitor to Crestor. She provided [place] with a fax number to the [place]. Sent notes to [facility] at [phone]. Looks like Dr. Dagoberto Ligas follows his dyslipidema and changed him to Crestor. Faxed notes from 05/06/2008,02/05/2008,and 09/14/2007 per pt's wife request. Follow-up by: Suzan Garibaldi RN

## 2010-06-23 NOTE — Cardiovascular Report (Signed)
Summary: Office Visit   Office Visit   Imported By: Roderic Ovens 07/24/2009 14:13:34  _____________________________________________________________________  External Attachment:    Type:   Image     Comment:   External Document

## 2010-06-23 NOTE — Assessment & Plan Note (Signed)
Summary: 6 MONTH RECK/MT  Medications Added ALLOPURINOL 100 MG TABS (ALLOPURINOL) as needed COLCHICINE 0.6 MG TABS (COLCHICINE) as needed      Allergies Added: NKDA  Visit Type:  Follow-up Primary Provider:  Dr. Dagoberto Ligas  CC:  CAD.  History of Present Illness: The patient presents for followup of his known coronary disease with cardiomyopathy. He has done quite well since I last saw him. At the last visit he had stress testing and an echo in anticipation of being on the renal transplant list. However, he subsequently decided not to go through with this. He says he is doing well with dialysis. His blood pressure tolerates dialysis. He has not had any hypotension, presyncope or syncope. He's had no chest pressure, neck or arm discomfort. He has had no shortness of breath, PND or orthopnea. He has had no weight gain or edema.  Current Medications (verified): 1)  Allopurinol 100 Mg Tabs (Allopurinol) .... As Needed 2)  Glipizide 2.5 Mg Xr24h-Tab (Glipizide) .Marland Kitchen.. 1 Tab Once Daily 3)  Felodipine 10 Mg Xr24h-Tab (Felodipine) .... Once Daily 4)  Aspirin Adult Low Strength 81 Mg Tbec (Aspirin) .... Once Daily 5)  Crestor 40 Mg Tabs (Rosuvastatin Calcium) .... Once Daily 6)  Toprol Xl 50 Mg Xr24h-Tab (Metoprolol Succinate) .... At Bedtime 7)  Phoslo 667 Mg Caps (Calcium Acetate (Phos Binder)) .... 2 Tabs Three Times A Day 8)  Nephro-Vite .Marland Kitchen.. 1 Tab Daily 9)  Colchicine 0.6 Mg Tabs (Colchicine) .... As Needed 10)  Hectrol .... 1mg  With Iv/hd  Allergies (verified): No Known Drug Allergies  Past History:  Past Medical History: PROSTATE CANCER-PERSONAL HISTORY (ICD-V10.46) HYPERTENSION, BENIGN ESSENTIAL (ICD-401.1) GOUT (ICD-274.9) CAD Ischemic myopathy (he has been 10-20% in the past, but was most recently 40-45%). Chronic kidney disease, dialysis dependant. Diabetes mellitus. Prostate cancer status post radiation. Dyslipidemia.  Past Surgical History: Reviewed history from 07/17/2009  and no changes required. ICD (placed on November 05, 2004, St. Jude single-chamber Atlas [id number]) CABG (2006 with a LIMA to the LAD, vein graft to first obtuse marginal, vein graft to the PDA, and vein graft to the diagonal). Mitral valve repair with a #28 annuloplasty ring. Dialysis fistula  Review of Systems       As stated in the HPI and negative for all other systems.   Vital Signs:  Patient profile:   70 year old male Height:      66 inches Weight:      142 pounds BMI:     23.00 Pulse rate:   65 / minute Resp:     16 per minute BP sitting:   120 / 64  (left arm)  Vitals Entered By: Kem Parkinson (January 13, 2010 3:40 PM)  Physical Exam  General:  Well developed, well nourished, in no acute distress. Head:  normocephalic and atraumatic Neck:  Neck supple, no JVD. No masses, thyromegaly or abnormal cervical nodes. Chest Wall:  Well healed ICD and sternotomy scars Lungs:  Clear bilaterally to auscultation and percussion. Abdomen:  Bowel sounds positive; abdomen soft and non-tender without masses, organomegaly, or hernias noted. No hepatosplenomegaly. Msk:  Back normal, normal gait. Muscle strength and tone normal. Extremities:  right upper arm dialysis graft and fistula with thrill and bruit Neurologic:  Alert and oriented x 3. Skin:  Intact without lesions or rashes. Psych:  Normal affect.   Detailed Cardiovascular Exam  Neck    Carotids: Carotids full and equal bilaterally without bruits.      Neck Veins: Normal, no  JVD.    Heart    Inspection: no deformities or lifts noted.      Palpation: normal PMI with no thrills palpable.      Auscultation: regular rate and rhythm, S1, S2 without murmurs, rubs, gallops, or clicks.    Vascular    Abdominal Aorta: no palpable masses, pulsations, or audible bruits.      Femoral Pulses: normal femoral pulses bilaterally.      Pedal Pulses: normal pedal pulses bilaterally.      Radial Pulses: normal radial pulses bilaterally.       Peripheral Circulation: no clubbing, cyanosis, or edema noted with normal capillary refill.     EKG  Procedure date:  01/13/2010  Findings:      Sinus rhythm, rate 65, axis within normal limits,, no acute ST-T wave changes   ICD Specifications Following MD:  Sherryl Manges, MD     Referring MD:  [facility] ICD Vendor:  St Jude     ICD Model Number:  [id number]     ICD Serial Number:  [id number] ICD DOI:  11/05/2004     ICD Implanting MD:  Sherryl Manges, MD  Lead 1:    Location: [facility]     DOI: 11/05/2004     Model #: [id number]     Serial #: [id number]     Status: active  Indications::  ICM   ICD Follow Up ICD Dependent:  No      Brady Parameters Mode VVI     Lower Rate Limit:  40      Tachy Zones VF:  240     VT:  211     VT1:  171     Impression & Recommendations:  Problem # 1:  CAD, ARTERY BYPASS GRAFT (ICD-414.04) The patient is having no new symptoms. No further cardiovascular testing is suggested. He will continue with risk reduction. Orders: EKG w/ Interpretation (93000)  Problem # 2:  ISCHEMIC CARDIOMYOPATHY (ICD-414.8) He seems to be euvolemic. I don't think he would tolerate up titration of his meds. He will continue with the labs as listed.  Problem # 3:  DYSLIPIDEMIA (ICD-272.4) He said he had his lipid profile done recently at the [place]. They will track these down and to send them to me so that I may review.  Patient Instructions: 1)  Your physician recommends that you schedule a follow-up appointment in: 6 months with Dr Antoine Poche 2)  Your physician recommends that you continue on your current medications as directed. Please refer to the Current Medication list given to you today.

## 2010-06-25 NOTE — Progress Notes (Signed)
Summary: Records Request   Faxed OV to Debbie at [facility] ([phone]).  Debby Freiberg  June 17, 2010 12:08 PM

## 2010-06-25 NOTE — Cardiovascular Report (Signed)
Summary: Office Visit Remote   Office Visit Remote   Imported By: Roderic Ovens 05/20/2010 11:50:39  _____________________________________________________________________  External Attachment:    Type:   Image     Comment:   External Document

## 2010-06-25 NOTE — Letter (Signed)
Summary: Remote Device Check  [employer], Main Office  [address]   [place], [place] [address]   Phone: [phone]  Fax: [phone]     May 08, 2010 MRN: [id number]   [facility] [address] [place], [place]  [address]   Dear Mr. Bromell,   Your remote transmission was recieved and reviewed by your physician.  All diagnostics were within normal limits for you.   __X____Your next office visit is scheduled for:  August 2012 with Dr Graciela Husbands. Please call our office to schedule an appointment.    Sincerely,  Vella Kohler

## 2010-07-03 ENCOUNTER — Other Ambulatory Visit (HOSPITAL_COMMUNITY): Payer: Self-pay

## 2010-07-07 ENCOUNTER — Ambulatory Visit (HOSPITAL_COMMUNITY)
Admission: RE | Admit: 2010-07-07 | Discharge: 2010-07-07 | Disposition: A | Discharge status: Home / Self Care | Payer: Medicare Other | Source: Ambulatory Visit | Attending: Nephrology | Admitting: Nephrology

## 2010-07-07 ENCOUNTER — Other Ambulatory Visit (HOSPITAL_COMMUNITY): Payer: Self-pay | Admitting: Nephrology

## 2010-07-07 DIAGNOSIS — T82898A Other specified complication of vascular prosthetic devices, implants and grafts, initial encounter: Secondary | ICD-10-CM | POA: Insufficient documentation

## 2010-07-07 DIAGNOSIS — N186 End stage renal disease: Secondary | ICD-10-CM

## 2010-07-07 DIAGNOSIS — Y832 Surgical operation with anastomosis, bypass or graft as the cause of abnormal reaction of the patient, or of later complication, without mention of misadventure at the time of the procedure: Secondary | ICD-10-CM | POA: Insufficient documentation

## 2010-07-07 MED ORDER — IOHEXOL 300 MG/ML  SOLN
100.0000 mL | Freq: Once | INTRAMUSCULAR | Status: AC | PRN
  Administered 2010-07-07: 45 mL via INTRAVENOUS

## 2010-07-16 ENCOUNTER — Encounter: Payer: Self-pay | Admitting: Cardiology

## 2010-08-04 ENCOUNTER — Encounter: Payer: Self-pay | Admitting: *Deleted

## 2010-08-11 NOTE — Letter (Signed)
Summary: Device-Delinquent Phone Journalist, newspaper, Main Office  [address]   [place], [place] [address]   Phone: [phone]  Fax: [phone]     August 04, 2010 MRN: [id number]   [facility] [address] [facility] [address] [place], [place]  [address]   Dear Mr. Choma,  According to our records, you were scheduled for a device phone transmission on 07-30-2010.     We did not receive any results from this check.  If you transmitted on your scheduled day, please call us to help troubleshoot your system.  If you forgot to send your transmission, please send one upon receipt of this letter.  Thank you,   Architectural [facility]ologist [facility]

## 2010-08-19 ENCOUNTER — Ambulatory Visit (INDEPENDENT_AMBULATORY_CARE_PROVIDER_SITE_OTHER): Payer: BC Managed Care – PPO | Admitting: *Deleted

## 2010-08-19 ENCOUNTER — Other Ambulatory Visit: Payer: Self-pay

## 2010-08-19 DIAGNOSIS — I2589 Other forms of chronic ischemic heart disease: Secondary | ICD-10-CM

## 2010-08-19 DIAGNOSIS — Z95810 Presence of automatic (implantable) cardiac defibrillator: Secondary | ICD-10-CM

## 2010-08-19 NOTE — Progress Notes (Signed)
Remote ICD check  

## 2010-09-07 LAB — GLUCOSE, CAPILLARY: Glucose-Capillary: 171 mg/dL — ABNORMAL HIGH (ref 70–99)

## 2010-09-07 LAB — POCT I-STAT 4, (NA,K, GLUC, HGB,HCT)
Glucose, Bld: 158 mg/dL — ABNORMAL HIGH (ref 70–99)
HCT: 45 % (ref 39.0–52.0)
Hemoglobin: 15.3 g/dL (ref 13.0–17.0)

## 2010-09-22 ENCOUNTER — Encounter: Payer: Self-pay | Admitting: *Deleted

## 2010-10-05 ENCOUNTER — Other Ambulatory Visit (HOSPITAL_COMMUNITY): Payer: Self-pay | Admitting: Nephrology

## 2010-10-05 DIAGNOSIS — N186 End stage renal disease: Secondary | ICD-10-CM

## 2010-10-06 NOTE — Consult Note (Signed)
NAME:  [name] [name], HOMES NO.:  [id number]   MEDICAL RECORD NO.:  [id number]          PATIENT TYPE:  INP   LOCATION:  2909                         FACILITY:  [facility]   PHYSICIAN:  Rich Number, MD   DATE OF BIRTH:  12-27-1940   DATE OF CONSULTATION:  12/25/2007  DATE OF DISCHARGE:                                 CONSULTATION   REASON FOR CONSULTATION:  Acute renal failure on chronic renal  insufficiency and possible need for hemodialysis.   HISTORY OF PRESENT ILLNESS:  [name] [name] is a 70 year old male with an  extensive past medical history and known chronic renal insufficiency  stage IV who, for the past 24 hours, has a history of decreased urine  output, poor appetite, increased shortness of breath and work of  breathing.  This PM, per advise of the attending physician, the patient  tripled his p.o. Lasix home dose without an increase in urine output as  expected.  He continued to have shortness of breath, so he presented to  the [facility] ED.  He subsequently has developed a  cough that is productive of pink, frothy sputum and increased oxygen  requirement.   PAST MEDICAL HISTORY:  1. Left wrist fistula placement on 12/15/2007.  2. Chronic renal insufficiency, stage IV with plans for probable need      of hemodialysis soon.  3. Hypotension.  4. Anemia.  5. Secondary hyperparathyroidism.  6. Hyperlipidemia.  7. Diabetes.  8. Gout.  9. Prostate cancer status post radiation.  10.History of bilateral knee arthroplasties.  11.Status post CABG, four vessel in 2006.  12.Coronary artery disease, ischemic cardiomyopathy and an AICD placed      with an EF estimated to be less than 40% at last echo.  13.Status post mitral valve replacement .   SOCIAL HISTORY:  The patient lives in [place] with his wife.  He is a  retired Emergency planning/management officer.  He is a former smoker with a 15-pack year  history and quit in 1987.  He does not use alcohol or other  drugs.   FAMILY HISTORY:  Pertinent for a mother who died of an MI at age 98,  father died at age 39 of lung cancer, sister with hypertension.   HOME MEDICATIONS:  1. Lipitor 80 mg q.h.s.  2. Hectorol 2.25 mg daily.  3. Glipizide 5 mg p.o. b.i.d.  4. Allopurinol 100 mg p.r.n.  5. Metoprolol ER 100 mg p.o. daily.  6. K-Ciel 20 mEq p.o. daily.  7. Felodipine 10 mg p.o. daily.  8. Lasix 40 mg p.o. b.i.d.  9. Aspirin p.o. daily.  10.Hydralazine 100 mg p.o. t.i.d.  11.Isosorbide dinitrate 30 mg p.o. t.i.d. in a patient who uses CPAP      q.h.s.   ALLERGIES:  No known drug allergies.   REVIEW OF SYSTEMS:  Positive for poor appetite, fatigue, weight change,  decreased urine output, shortness of breath, edema, cough, nausea,  hiccups, but is otherwise negative on greater than 12 points.   PHYSICAL EXAMINATION:  VITAL SIGNS:  Temperature 99.1, pulse 90,  respirations 22,  blood pressure initially 204/103, decreased to 156/92  with nitroglycerin drip.  O2 98% on 4 liters by nasal cannula.  GENERAL:  Ill-appearing African American male lying in bed, though he is  in no apparent distress currently.  HEENT:  Normocephalic, atraumatic.  Sclerae are clear.  Pupils equal,  round and reactive to light.  Nares clear without discharge.  Mucous  membranes tachy.  Dentition fair.  Oropharynx without erythema or  exudates.  NECK:  Supple without lymphadenopathy, but there is JVD to approximately  the angle of the jaw.  CARDIOVASCULAR;  Regular rate and rhythm.  No murmurs, rubs or gallops.  Normal PMI.  Pulses are 2+ and equal without bruits appreciated.  LUNGS:  Reveal crackles, right greater than left with an increased work  of breathing.  Of note, the patient is lying on the right side, and his  crackles are greater on this dependent side.  SKIN:  Reveals no rashes or lesions.  GI:  Soft, nontender, normoactive bowel sounds with no  hepatosplenomegaly appreciated.  EXTREMITIES:  Revealed 2+  pitting edema on the right leg and trace  pitting edema on the left leg, given that the patient is laying on his  right side, so this is his dependent side.  Access wise, the patient has  an AV fistula on the left wrist with a positive bruit and palpable  thrill.  NEUROLOGICAL:  He is alert and oriented x3 with cranial nerves II-XII  grossly intact.  He does have perhaps minimal asterixis.   STUDIES:  Chest x-ray:  Portable reveals moderately severe CHF with a  small left pleural effusion.   LABORATORY DATA:  Urinalysis revealed specific gravity of 1.011, pH 5,  100 protein, rare epithelials, granular casts, mucous and rare bacteria.  CBC reveals white blood cell count less than 0.8, hemoglobin 12.2,  hematocrit 36.7, platelets 156 with 15% monocytes and an absolute  neutrophil count of 8.3.  Sodium is 139, potassium 3.8, chloride 109,  bicarbonate 17, BUN 78, creatinine 7.76, glucose 126, calcium 9.2 with  an estimated GFR of 9.  BNP is 2871.  Point of care cardiac enzymes  reveal CK MB 1.6, troponin I less than 0.05, myoglobin 239.   ASSESSMENT:  [name] [name] is a 70 year old gentleman with chronic kidney  disease, now stage V, congestive heart failure exacerbation, coronary  artery disease, hypertension, diabetes, anemia and secondary  hyperparathyroidism.   PLAN:  1. Chronic kidney disease.  Likely the patient has reached the point      of needing hemodialysis.  His graft seems to be functioning, but it      may be too early to use still at this point in time, given it was      just placed on December 15, 2007.  We will try to avoid emergent      hemodialysis by giving a trial of Lasix 160 mg IV times one and      will monitor his urine output, but if there is minimal urine output      or even none at approximately 30 minutes, we will likely proceed to      hemodialysis emergently and will need to place a catheter.      Certainly, we will follow along closely and help get things set  up      if the patient does need hemodialysis long term from this point      forward.  2. Congestive heart failure.  Certainly Lasix +/- hemodialysis  will be      helpful to his breathing and his fluid overload.  We will leave it      up to his primary team to help determine his reason for his      exacerbation, whether it be cardiac in nature or actually related      to worsening renal function.  3. Coronary artery disease per primary team.  4. Hypertension.  Has improved with the nitroglycerin drip and is      otherwise per the primary team.  5. Diabetes.  Also per primary team.  6. Anemia, is currently stable.  We will check into, in the a.m.,      whether the patient gets Epogen or Aronesp as an outpatient, and      will get these medications started per his outpatient records.  7. Secondary hyperparathyroidism.  The patient is on Hectorol at      baseline.  We will check a renal panel in the morning to evaluate      his other electrolytes.  8. Electrolytes currently are stable.  We will continue to monitor      them and use it also to help [place] determine whether hemodialysis is      necessary.   DISPOSITION:  Certainly pending improvement in his CHF and in decision  about long-term hemodialysis needs from this point.  We appreciate the  consult and we will be following along.  We will be glad to help in any  way that we can.      Ancil Boozer, MD  Electronically Signed      ______________________________  Rich Number, MD    SA/MEDQ  D:  12/25/2007  T:  12/25/2007  Job:  [id number]

## 2010-10-06 NOTE — Procedures (Signed)
VASCULAR LAB EXAM   INDICATION:  Follow up immature left arm AV fistula.  Patient with  history of end-stage renal disease.   HISTORY:  Diabetes:  Yes.  Cardiac:  Yes.  Hypertension:  Yes.   EXAM:  Duplex left arm AV fistula.   IMPRESSION:  High-grade stenosis noted at the proximal anastomosis site  and a branch also noted proximal to the proximal anastomosis.   ___________________________________________  V. Charlena Cross, MD   MG/MEDQ  D:  01/15/2008  T:  01/15/2008  Job:  [id number]

## 2010-10-06 NOTE — Assessment & Plan Note (Signed)
[facility]                            CARDIOLOGY OFFICE NOTE   [name] [name], [name] [name] [name] [name]                       MRN:          [id number]  DATE:09/14/2007                            DOB:          March 15, 1941    PRIMARY CARDIOLOGIST:  Rollene Rotunda, MD.   PRIMARY CARE Ajeet Casasola:  [name], MD.   PATIENT PROFILE:  A 70 year old African-American male with prior history  of CAD and ischemic cardiomyopathy, who presents for routine followup.   PROBLEM LIST:  1. Coronary artery disease.  Status post coronary artery bypass graft      x4 in 2006 with a LIMA to the LAD, vein graft to the first obtuse      marginal, vein graft to the PDA and vein graft to the diagonal  2. Status post mitral valve repair with a number 28 [place]      annuloplasty ring.  3. Ischemic cardiomyopathy/chronic systolic congestive heart failure      with an EF of 10-20% in the past now 40-45%.  4. Chronic kidney disease followed by renal.  5. Diabetes mellitus.  6. Hypertension.  7. Gout.  8. Prostate cancer status post radiation.  9. Hyperlipidemia.   ALLERGIES:  NO KNOWN DRUG ALLERGIES.   HISTORY OF PRESENT ILLNESS:  A 70 year old African-American male with  the above problem list.  He was last seen in clinic in June 2008.  Since  then, he has done well without any significantly limiting dyspnea on  exertion, chest discomfort, PND, orthopnea, dizziness, syncope, edema or  early satiety.  He is not exercising per say, but is active in his yard  without any significant limitations.  He weighs himself about 2-3 times  per week and notes that his weight at home is 160-161 pounds on his  scale.  He does not think he has gained any weight and thinks that he is  fairly euvolemic.  He occasionally will check his blood pressure and  notes that it is in the 140s.   HOME MEDICATIONS:  1. Lipitor 80 mg q.h.s.  2. Hectorol 0.25 mg daily.  3. Glipizide 5 mg b.i.d.  4. Allopurinol  100 mg p.r.n.  5. Metoprolol ER 100 mg daily.  6. Potassium chloride 20 mEq daily.  7. Felodipine 10 mg daily.  8. BiDil 2 tablets t.i.d.  9. Furosemide 40 mg b.i.d.  10.Aspirin daily.   PHYSICAL EXAMINATION:  VITAL SIGNS:  Blood pressure 144/71, heart rate  55, respirations 16, weight is 168 pounds by our scales, up 6 pounds  since October 2008.  GENERAL:  Pleasant African-American male in no acute distress.  Awake,  alert and oriented x3.  HEENT:  Normal.  NEURO:  Grossly intact, nonfocal.  SKIN:  Warm, dry without lesions or masses.  NECK:  No bruits or JVD.  LUNGS:  Respirations are unlabored.  Clear to auscultation.  CARDIAC:  Regular S1 and S2.  No S3, S4 or murmurs.  ABDOMEN:  Round, soft, nontender and nondistended.  Bowel sounds are  present.  EXTREMITIES:  Warm, dry, no clubbing, cyanosis or edema.  Soft pedis and  posterior tibial pulses are 2+ and equal bilaterally.   DIAGNOSTICS:  EKG shows sinus bradycardia without any acute ST/T  changes.  There is poor R-wave progression.   ASSESSMENT:  1. Coronary artery disease.  The patient is stable from a CAD      standpoint.  He has not had any recurrent chest discomfort.  He      remains on aspirin, statin and beta blocker therapy.  2. Chronic systolic congestive heart failure/ischemic cardiomyopathy.      The patient is doing well from this standpoint and remains      euvolemic.  He is currently on beta blocker and BiDil.  He was      previously placed on BiDil as part of a trial and he no longer will      receive free BiDil through there.  He is asking what to do.  I have      gone ahead and given him a prescription for hydralazine 100 mg      t.i.d. as well as isosorbide dinitrate 30 mg t.i.d.  This is a      slight increase in his prior doses secondary to his elevated blood      pressure.  He remains on Lasix 40 p.o. b.i.d.  He notes that he      urinates quite a bit during the night and I have recommended he       drop his p.m. dose back to no later than 4 p.m. in the afternoon.  3. Hypertension as above.  Blood pressure runs in the 140s at home and      is 140 today.  I have gone ahead and bumped his hydralazine and      nitrates.  Otherwise, he remains on felodipine and beta-blocker      therapy.  4. Chronic kidney disease followed by renal.  5. Status post automatic implantable cardioverter defibrillator.  He      had this interrogated today.  We recommended a 3 month followup,      although he prefers to have ICD followup with his 6 month visit.   DISPOSITION:  The patient will follow up with Dr. Antoine Poche in 6 months  and sooner if necessary.      Nicolasa Ducking, ANP  Electronically Signed      Rollene Rotunda, MD, [facility]  Electronically Signed   CB/MedQ  DD: 09/14/2007  DT: 09/14/2007  Job #: [id number]

## 2010-10-06 NOTE — Consult Note (Signed)
NAME:  [name] [name], STREY NO.:  [id number]   MEDICAL RECORD NO.:  [id number]          PATIENT TYPE:  INP   LOCATION:  6742                         FACILITY:  [facility]   PHYSICIAN:  Doralee Albino. Carola Frost, M.D. DATE OF BIRTH:  02/27/1941   DATE OF CONSULTATION:  12/29/2007  DATE OF DISCHARGE:                                 CONSULTATION   CHIEF COMPLAINT:  Right knee pain and swollen right knee.   REQUESTING PHYSICIAN:  Rollene Rotunda, MD, [facility].   HISTORY OF PRESENT ILLNESS:  The patient is a 70 year old male admitted  on December 24, 2007, secondary to pulmonary edema due to ischemia and end-  stage renal disease.  The patient is being followed by Cardiology and  Nephrology.  The patient is now stabilized with complaints of right knee  pain and swelling.  He states that it started approximately 2 days ago.  No acute events noted.  Orthopedics was consulted to evaluate and treat  patient's right knee pain as well as the right knee.   PAST MEDICAL HISTORY:  1. Bilateral knee scopes.  2. History of gout, many years, without flare.  3. Diabetes mellitus.  4. Kidney disease.  5. Coronary artery disease.  6. Congestive heart failure.  7. Hypertension.   MEDICATIONS:  Please refer to E-chart.   FAMILY HISTORY:  Please refer to E-chart.   SOCIAL HISTORY:  Positive tobacco use, but the patient quit more than 25  years ago.  Occasional EtOH use as well.   PHYSICAL EXAMINATION:  VITAL SIGNS:  The patient is afebrile.  Heart  rate 68, respirations 20, 95%, BP is 147/75.  GENERAL:  The patient is awake, alert, very comfortable, appears well,  in no acute distress.  Focused exam of the right lower extremity is  significant for a contusion of the right knee as noted.  No additional  gross deformity is appreciated.  I do appreciate fluid in the knee joint  medially and laterally.  No pain to palpation involving the soft tissues  of the leg.  No ligamentous laxity is appreciated on  varus and valgus  stress of right knee.  No anterior posterior laxity is appreciated as  well.  No tenderness of flank __________.  The patient demonstrates good  active range of motion of the right ankle and hip as well.  The patient  also demonstrates good strength of the right ankle, knee, and hip also.  The patient does have some crepitus with knee range of motion; however,  range of motion is not blocked.  The patient demonstrates active quad  and hamstring contractions.  Distal sensory functions along. The deep  peroneal nerve, superficial peroneal nerve and tibial nerve is intact.  EHL, FHL, anterior tibialis, posterior tibialis, peroneals, gastroc-  soleus, motor flexion is also intact.  There is palpable dorsalis pedis  pulses in right side.  No significant distal extremity edema is  appreciated aside from the right knee effusion.   X-RAYS:  No x-rays of the right knee are available.   ASSESSMENT AND PLAN:  This is 70 year old with significant past  medical  history with right knee effusion.  1. We will perform an aspiration of the right knee joint fluid for      analysis, question of possible gout flare.  We will send the fluid      off for complete cell count, culture and sensitivities, Gram stain      and crystal analysis.  2. The patient has no formal range of motion of activity restriction      from an orthopedic standpoint.  3. We will await fluid analysis results and treat accordingly.   ORTHOPEDIC PROCEDURE:  Arthrocentesis of the right knee   PERMIT:  The patient was explained risks and benefits of aspiration, as  well as why aspiration is beneficial.  The patient reviewed and elected  to proceed.   INDICATIONS:  Rule out infectious/septic arthritis and possibility of  gouty flare versus inflammatory arthritis.   CLINICIAN:  Mearl Latin, PA-C.   DESCRIPTION:  Right knee was prepped with Betadine.  Aspiration site was  located along the superolateral aspect of  the right knee approximately 2  cm superior and 2 cm posterior to the superior pole of the patella.  Skin was infiltrated with approximately 3 mL of 1% lidocaine without  epinephrine.  The knee joint was then entered using an 18-gauge needle  and attached to a 20 mL syringe.  The knee joint gave a topical pop upon  entrance.  The plunger was slowly retracted and straw colored fluid was  obtained; after 20 mL was reached, 18-gauge needle was retained, and  __________ and the full syringe was removed.  A new 20 mL syringe was  applied.  Again, plunger was retracted, which yielded approximately 25  mL of straw-colored fluid with blood tinge.  The syringe was then  removed with the 18-gauge needle attached.  The area was cleaned and  Band-Aid applied.   COMPLICATIONS:  None.   ESTIMATED BLOOD LOSS:  None.   FLUID:  Approximately 45 mL of straw-colored fluid, right knee.   DISPOSITION:  The fluid is to be analyzed for crystals, cell count,  culture and sensitivities, as well as a Gram stain.  The patient is in  good condition and stable.  Please refer to the progress note and  consultation for more detailed plan.      Mearl Latin, PA      Doralee Albino. Carola Frost, M.D.  Electronically Signed    KWP/MEDQ  D:  12/31/2007  T:  01/01/2008  Job:  [id number]

## 2010-10-06 NOTE — Procedures (Signed)
CEPHALIC VEIN MAPPING   INDICATION:  End-stage renal disease.   HISTORY:  End-stage renal disease.   EXAM:  Bilateral cephalic vein mapping.   The right cephalic vein is compressible.   Diameter measurements range from 0.24 to 0.26.   The left cephalic vein is compressible.   Diameter measurements range from 0.29 to 0.26.   See attached worksheet for all measurements.   IMPRESSION:  Patent bilateral cephalic veins which are marginally of  acceptable diameter for use as a dialysis access site.   ___________________________________________  Larina Earthly, M.D.   MG/MEDQ  D:  12/08/2007  T:  12/08/2007  Job:  [id number]

## 2010-10-06 NOTE — Assessment & Plan Note (Signed)
[facility]                         ELECTROPHYSIOLOGY OFFICE NOTE   NAME:[name] [name], [name] [name]                       MRN:          [id number]  DATE:03/13/2007                            DOB:          07-03-1940    [name] [name] is seen for ischemic heart disease status post ICD  implantation.  He has had significant improvement in his LV systolic  function from about 40-98% up to about 40%.  He denies problems with  shortness of breath or chest discomfort.  He has had no problems with  peripheral edema, palpitations or syncope.   His medications currently include Lipitor, glipizide, furosemide,  metoprolol, felodipine and BiDil.  He is not on an ACE inhibitor because  of renal insufficiency - he has a creatinine about 4.   On examination, his blood pressure was elevated today at 161/82 with  pulse of 57.  His lungs were clear, heart sounds were regular and the  extremities were without edema.   Interrogation of his St. Jude pulse generator demonstrates an R wave of  12 with impedance of 485, a threshold of 0.5 at 0.5.  Battery voltage is  3.5.  There are no intercurrent therapies.   IMPRESSION:  1. Ischemic cardiomyopathy.      a.     Prior myocardial infarction.      b.     Ejection fraction of about 25-45%.  2. Status post implantable cardioverter-defibrillator for primary      prevention.  3. Hypertension.  4. Renal insufficiency - chronic.  5. Diabetes.   [name] [name] is doing fine from an arrhythmia point of view.  I am concerned  about his blood pressure and asked that he follow it very closely and be  in contact with Dr. Dagoberto Ligas if the blood pressure is staying high as this  is the most important thing he can do probably to prevent a rapid  acceleration of his renal insufficiency.   We will see him again in the device clinic at 3 months' time.     Duke Salvia, MD, [facility]  Electronically Signed    SCK/MedQ  DD: 03/13/2007  DT: 03/14/2007  Job  #: [id number]

## 2010-10-06 NOTE — Op Note (Signed)
NAME:  [name] [name], [name] [name] NO.:  [id number]   MEDICAL RECORD NO.:  [id number]          PATIENT TYPE:  AMB   LOCATION:  SDS                          FACILITY:  [facility]   PHYSICIAN:  Quita Skye. Hart Rochester, M.D.  DATE OF BIRTH:  05-12-1941   DATE OF PROCEDURE:  01/17/2008  DATE OF DISCHARGE:                               OPERATIVE REPORT   PREOPERATIVE DIAGNOSIS:  Poorly developing arteriovenous fistula, left  upper extremity (Cimino).   POSTOPERATIVE DIAGNOSIS:  Poorly developing arteriovenous fistula, left  upper extremity (Cimino).   OPERATION:  Ligation of competing branch, left forearm AV fistula.   SURGEON:  Quita Skye. Hart Rochester, MD   FIRST ASSISTANT:  Nurse.   ANESTHESIA:  Local.   PROCEDURE:  The patient was taken to the operating room, placed in  supine position at which time the left upper extremity was prepped with  Betadine scrub solution draped in routine sterile manner.  After  infiltration of 1% Xylocaine with epinephrine, a short longitudinal  incision was made over the branch of the Cimino AV fistula, which had  been marked in the vascular lab preoperatively.  This was in the lower  third of the forearm about 8-10 cm from the anastomosis.  Branch was  identified and was dissected back to the cephalic vein, which was  ligated with 4-0 silk tie.  Cephalic vein was dissected free slightly  proximally and distally just to be certain there were no other  significant branches and there were not.  Adequate hemostasis was  achieved.  Wound was closed in layers with Vicryl in subcuticular  fashion.  Sterile dressing applied.  The patient taken to recovery room  in satisfactory condition.      Quita Skye Hart Rochester, M.D.  Electronically Signed     JDL/MEDQ  D:  01/17/2008  T:  01/18/2008  Job:  [id number]

## 2010-10-06 NOTE — Assessment & Plan Note (Signed)
OFFICE VISIT   [name], [name] COCHRANE  DOB:  Jan 08, 1941                                       02/26/2008  EAVWU#:[id number]   REASON FOR VISIT:  Followup fistula.   HISTORY:  This is a 70 year old gentleman who is status post left wrist  fistula placed July 24.  He had a non-maturing fistula and at the end of  August underwent branch ligation.  He comes back in today for further  followup.  His vein is dilated nicely.  The thrill is more prominent in  the distal forearm.  However, the pain is easily palpable up to the  elbow.   I think we should probably delay access for several more weeks, but this  should serve as a good conduit for him for dialysis.   Jorge Ny, MD  Electronically Signed   VWB/MEDQ  D:  02/26/2008  T:  02/27/2008  Job:  [id number]   cc:   Cecille Aver, M.D.

## 2010-10-06 NOTE — H&P (Signed)
NAME:  [name] [name], NORDIN NO.:  [id number]   MEDICAL RECORD NO.:  [id number]          PATIENT TYPE:  INP   LOCATION:  1823                         FACILITY:  [facility]   PHYSICIAN:  Christell Faith, MD   DATE OF BIRTH:  [birth year]   DATE OF ADMISSION:  12/24/2007  DATE OF DISCHARGE:                              HISTORY & PHYSICAL   PRIMARY CARE PHYSICIAN:  [name], MD   PRIMARY NEPHROLOGIST:  Cecille Aver, MD   CHIEF COMPLAINT:  Shortness of breath and cough.   HISTORY OF PRESENT ILLNESS:  This is a 70 year old African-American male  with congestive heart failure and chronic kidney disease who has been  making progressively less urine over the past week and at the same time  has been becoming increasingly short of breath.  Today, he has been  coughing incessantly and has had pink frothy sputum.  He denies chest  pain.  He denies fevers, chills, or sick contacts.  He has been  progressing towards end-stage renal disease, and had a left wrist  fistula placed approximately 2 weeks ago.   PAST MEDICAL HISTORY:  1. Ischemic heart disease status post non-ST elevation MI in 2005,      status post coronary artery bypass surgery in 2006 with LIMA to      LAD, vein graft to OM1, vein graft to D1, and vein graft to PDA.  2. Status post mitral valve annuloplasty ring.  3. Ischemic cardiomyopathy.  EF has been as well 10-15% in the past,      more recently 40-45% on echocardiogram from June 2007.  He is      status post St. Jude's dual chamber AICD.  4. Chronic kidney disease, nearing end-stage renal disease.  5. Hypertension.  6. Hyperlipidemia.  7. Diabetes mellitus type 2.  8. Prostate cancer status post radiation.  9. Status post left wrist AV fistula placement, December 15, 2007.  10.Gout.   SOCIAL HISTORY:  Lives in [place] with his wife.  He is a retired  Emergency planning/management officer.  He smoked a total of 15-pack years and quit in 1987.  No alcohol.  No  drugs.   FAMILY HISTORY:  Mother died of an MI at age 89.  Father died at age 64  of lung cancer.  The patient has one sister with hypertension.   ALLERGIES:  None.   MEDICATIONS:  1. Lasix 40 mg p.o. b.i.d.  2. Lipitor 80 mg p.o. daily.  3. Felodipine 10 mg p.o. daily.  4. Hydralazine 100 mg p.o. t.i.d.  5. Toprol-XL 200 mg p.o. daily.  6. Isordil 30 mg p.o. t.i.d.  7. Allopurinol 100 mg p.o. daily.  8. Calcitriol 0.25 mg p.o. b.i.d.  9. Glipizide 5 mg p.o. b.i.d.  10.Potassium chloride 20 mEq p.o. daily.   REVIEW OF SYSTEMS:  Positive for weight gain, headache, shortness of  breath, dyspnea on exertion, orthopnea, edema, cough, decreased urinary  frequency, decreased urinary output, and nausea.  Otherwise, the balance  of 14 systems is reviewed and is negative.   PHYSICAL EXAMINATION:  VITAL SIGNS:  Temperature 99.1, pulse 94,  respiratory rate 16, blood pressure 204/103, and saturation 95% on 2 L.  GENERAL:  This is an African-American male who looks uncomfortable  secondary to incessant coughing.  He is occasionally bringing up pink  frothy sputum.  HEENT:  Mucous membranes are moist.  Pupils are equal, round, and  reactive.  Sclerae are clear.  Extraocular movements are intact.  Dentition is fair.  NECK:  Supple.  Right atrial pressure estimated 16 cm of water.  No  carotid bruits.  No cervical lymphadenopathy.  CARDIAC:  Normal rate, regular rhythm.  Heart sounds obscured by noisy  respiratory sounds.  There is no obvious murmur or gallop.  LUNGS:  Reveal diffuse rales bilaterally all the way up.  No dullness to  percussion.  ABDOMEN:  Soft with an expanded liver span.  The liver is nontender.  There is no ascites.  Bowel sounds are normal.  EXTREMITIES:  Reveal  trace lower extremity bilaterally.  There is an AV fistula in the left  wrist with a good thrill to mildly swollen, but nontender.  No rash.  No  clubbing or cyanosis.  No ecchymosis or petechiae.   NEUROLOGIC:  The patient is awake, alert, and oriented x3.  Facial  expressions are symmetric and normal 5/5 strength in all 4 extremities.  MUSCULOSKELETAL:  No acute joint effusions or deformities.   DIAGNOSTIC TESTS:  Chest x-ray shows moderate congestive heart failure.  EKG shows sinus rhythm with frequent PVCs and a rate of 94 beats per  minute.  The previously noted inferolateral T-wave inversions are  improved on current exam.   LABORATORY DATA:  White blood cell 11.8, hemoglobin 12.2, and platelets  156.  Sodium 139, potassium 3.8, chloride 109, bicarbonate 17, BUN 78,  creatinine 7.7, glucose 126, CK-MB 1.6, and troponin negative.   IMPRESSION:  A 70 year old African-American man in pulmonary edema  secondary to ischemic cardiomyopathy and end-stage renal disease.   PLAN:  1. Admit to the Intensive Care Unit.  2. We will treat him acutely with a nitroglycerin drip and IV      morphine.  3. Nephrology has been called and will see the patient tonight, and      make a decision when to start dialysis, whether tonight or      tomorrow.  If he does not start dialysis tonight, anticipate using      IV Lasix plus metolazone.  He has already taken 120 mg of p.o.      Lasix at home with little effect.  4. Diabetes control with glipizide and sliding scale insulin coverage.  5. We will continue his other home blood pressure medicines.  6. For his ischemic heart disease continue statin and initiate      aspirin.  7. Anemia treatment and vitamin D supplementation per Nephrology.  8. Consider transfer to the Nephrology Service.      Christell Faith, MD  Electronically Signed     NDL/MEDQ  D:  12/25/2007  T:  12/25/2007  Job:  [id number]

## 2010-10-06 NOTE — Assessment & Plan Note (Signed)
OFFICE VISIT   [name] [name] CREQUE  DOB:  January 14, 1941                                       01/15/2008  ZOXWR#:[id number]   REASON FOR VISIT:  Evaluate left wrist fistula.   HISTORY:  This is a 70 year old gentleman who is status post left wrist  fistula performed on 12/15/2007.  Since that time, the patient has  required placement of a right-sided catheter for dialysis.  He comes in  today for evaluation of his fistula.  I had seen him in the hospital and  there was concern that his fistula was not maturing properly.  However,  it was very young at that time and I did not wish to proceed with an  invasive procedure such as a fistulogram for additional few weeks.  He  comes back in today for followup.   EXAM:  There is a good thrill proximally.  However, this is lost in the  proximal 2/3 of the forearm.  The the blood pressure today is 126/70,  pulse 85, and respirations are 18.  He is in no acute distress.   DIAGNOSTIC STUDIES:  Ultrasound of his graft was performed today.  This  reveals large branch approximately 3 cm proximal to the anastomosis.  There is also step up in velocities just distal to the anastomoses  measuring 771/359.   ASSESSMENT:  Non-maturing left arm fistula.   PLAN:  At this point I would like to proceed with branch ligation to see  if this improves flow through the fistula.  The patient understands that  he may require more proximal placement of his fistula if this is  unsuccessful.  He would like to get this done as soon as possible.  Therefore, he is going to be scheduled for Wednesday, August 26.  Again,  I plan on ligating the branch 3 cm proximal to the fistula.   [name] Ny, MD  Electronically Signed   VWB/MEDQ  D:  01/15/2008  T:  01/16/2008  Job:  [id number]   cc:   [name] [name], M.D.  Dr. Darrick [name]

## 2010-10-06 NOTE — Assessment & Plan Note (Signed)
[facility]                            CARDIOLOGY OFFICE NOTE   NAME:[name] [name], [name] [name]                       MRN:          [id number]  DATE:11/14/2006                            DOB:          12/11/1940    PRIMARY CARE PHYSICIAN:  Dr. Dagoberto Ligas.   REASON FOR PRESENTATION:  Ischemic cardiomyopathy.   HISTORY OF PRESENT ILLNESS:  The patient is 70 years old.  He presents  for followup.  He has had no cardiovascular complaints since I last saw  him.  He works in his yard and plays with his 72-year-old grandson.  With  this, he denies any shortness of breath.  He has no chest discomfort,  neck or arm discomfort.  He has no palpitation, pre-syncope or syncope.  He has had no PND or orthopnea.  He has had no swelling.  He is  following with renal.  They wanted to list him for transplant and put a  dialysis graft in his arm.  He has refused both.  His creatinine  apparently remains near 4.   PAST MEDICAL HISTORY:  Coronary artery disease (status post coronary  artery bypass grafting in 2006 with a LIMA to the LAD, SVG to first  obtuse marginal, SVG to PDA, SVG to diagonal), mitral valve repair with  a #28-mm Seguin annuloplasty ring, ischemic cardiomyopathy (EF 10 to 20%  in the past, now 40 to 45%), chronic renal insufficiency, diabetes  mellitus, hypertension, gout, prostate cancer status post radiation,  distal lipidemia.   ALLERGIES:  None.   CURRENT MEDICATIONS:  1. Lipitor 80 mg at bedtime.  2. Hectorol.  3. Glipizide 5 mg b.i.d..  4. Furosemide 40 mg daily.  5. Allopurinol 1000 mg daily.  6. Toprol 100 mg daily.  7. Potassium 20 mEq daily.  8. Felodipine 10 mg daily.  9. BiDil 20 mg two tablets three times a day   REVIEW OF SYSTEMS:  As stated in history of present illness, otherwise  negative for other systems.   PHYSICAL EXAMINATION:  Patient is in no distress.  Blood pressure  192/61, heart rate 60 and regular, weight 165 pounds.  NECK:   No jugular venous distention at 45%, carotid upstroke brisk and  symmetrical.  No bruits, thyromegaly.  LYMPHATICS:  No adenopathy.  LUNGS:  Clear to auscultation bilaterally.  BACK:  No costovertebral angle tenderness.  CHEST:  Unremarkable.  HEART:  PMI not displaced, was sustained, S1 and S2 within normal  limits.  No S3, no S4.  No clicks, no rubs,  no murmurs.  ABDOMEN:  Flat, positive bowel sounds.  Normal in frequency and pitch.  No bruits, rebounding, guarding.  No midline pulsatile mass, no  organomegaly.  SKIN:  No rashes, no nodules.  EXTREMITIES:  Two plus pulses.  No edema.   ICD check:  The patient did have a defibrillator check.  His RV lead  demonstrates greater than 12 volt amplitude, impedance 445, threshold  0.5, shock impedance 30, tachy zones are V-fib to 40, ventricular  tachycardia 211, parameters are VVI with a low rate of 40.  There  are no  interval events.  There are no changes made.  Battery voltage and charge  time are excellent.   ASSESSMENT AND PLAN:  1. Coronary artery disease:  The patient is having no symptoms.  No      further cardiovascular testing is suggested.  He will continue with      risk reduction.  2. Cardiomyopathy:  He has had dramatic improvement in his ejection      fraction between his surgery, his medicines and his CPAP.  He is      not having any symptoms.  He will continue with the regimen as      listed.  3. Risk reduction:  I did sternly tell him he needs to exercise more      routinely, walking five days a week, 52 weeks of the year.  4. Status post defibrillator:  He is doing well with this and will      continue routine followup.  He is to return in four months.  5. Followup:  I will see him back in six months or sooner if needed.     Rollene Rotunda, MD, [facility]  Electronically Signed    JH/MedQ  DD: 11/14/2006  DT: 11/14/2006  Job #: [id number]   cc:   [name], M.D.

## 2010-10-06 NOTE — Assessment & Plan Note (Signed)
OFFICE VISIT   Vanalstine, [name] [name]  DOB:  02-Mar-1941                                       05/20/2008  ZOXWR#:[id number]   REASON FOR VISIT:  Poorly maturing fistula.   HISTORY:  This is a 70 year old gentleman who was initially seen by Dr.  Arbie Cookey for dialysis access.  He was scheduled for a left wrist Cimino  fistula, I performed this procedure on July 24th.  He has had trouble  with this maturing.  He has competing branches ligated, he has also had  percutaneous attempt.   PHYSICAL EXAMINATION:  Today, his blood pressure is 149/72, pulse 73,  respirations 18.  There is a good thrill on the proximal third of his  arm.  However, this diminishes as you go up the arm.  He has mild  swelling, no pain; otherwise, his exam is unremarkable.   DIAGNOSTIC STUDIES:  I have reviewed his fistulogram.  He has stenosis  near the anastomosis, there is also some stenosis of cephalic vein  joints subclavian vein.  This also on the side of pacemaker which may be  flow-limiting.   ASSESSMENT/PLAN:  Poorly maturing fistula.   Plan:  I think given the patient's pacemaker location and the inability  of his left  cephalic vein fistula to mature, we should proceed with  contralateral access.  I plan on ligating the left side of fistula and  in the same setting placing a right upper arm AV fistula.  This was  discussed with the patient, they are okay with this plan, this has been  scheduled for Friday, January 8th.   [name] Ny, MD  Electronically Signed   VWB/MEDQ  D:  05/20/2008  T:  05/21/2008  Job:  [id number]   cc:   Cecille Aver, M.D.  Terrial Rhodes, M.D.

## 2010-10-06 NOTE — Consult Note (Signed)
NEW PATIENT CONSULTATION   [name], [name] SIRES  DOB:  [birth year]                                       12/08/2007  ZOXWR#:[id number]   The patient presents today for evaluation for AV access for  hemodialysis.  He is a 70 year old gentleman with progressive renal  insufficiency and a creatinine of 3.  He being seen for discussion of AV  access.  Up to this point, he has been somewhat resistant to considering  hemodialysis.  His creatinine, according to his wife is now in the 7  range.   PAST HISTORY:  Significant for hypertension, anemia, secondary  hypoparathyroidism, hyperlipidemia, diabetes mellitus.  He does have a  history of coronary artery bypass grafting in April of 2006.   PHYSICAL EXAM:  Well-developed, well-nourished black male appearing  stated age of 12.  His blood pressure is 146/77, pulse 57, respirations  18.  His chest is clear bilaterally.  He has 2+ radial pulses  bilaterally.  He has moderate-sized cephalic vein at the wrists  bilaterally on physical exam.   He underwent bilateral upper extremity vein mapping today, and this  reveals acceptable size for a fistula creation bilaterally.  He is right  handed.  I discussed options with the patient and his wife to include  Diatek catheter placement, AV fistula, AV graft, and the benefits of  each of these modalities.  I have recommended that he undergo a left  wrist Cimino fistula for initial attempt at access.  He is scheduled for  this at [facility] by Dr. Durene Cal on 07/24.   [name] [name], M.D.  Electronically Signed   TFE/MEDQ  D:  12/08/2007  T:  12/11/2007  Job:  [id number]   cc:   Cecille Aver, M.D.

## 2010-10-06 NOTE — Discharge Summary (Signed)
NAME:  SIMSKaylan, Friedmann NO.:  [id number]   MEDICAL RECORD NO.:  [id number]          PATIENT TYPE:  INP   LOCATION:  6742                         FACILITY:  [facility]   PHYSICIAN:  Rollene Rotunda, MD, FACCDATE OF BIRTH:  08/13/1940   DATE OF ADMISSION:  12/24/2007  DATE OF DISCHARGE:                         DISCHARGE SUMMARY - REFERRING   BRIEF HISTORY:  Mr. Gerding is a 70 year old African American male who  presented with decreased urine output over the preceding week with  increasing shortness of breath, coughing associated with pink frothy  sputum.  He denied any associated chest discomfort, fevers or chills.  His history is notable for chronic kidney disease with progressing  toward end stage with a left wrist fistula placed approximately 2 weeks  ago.   PAST MEDICAL HISTORY:  Also notable for ischemic cardiomyopathy with non-  ST elevated MI in 2005 with bypass surgery in 2006 with a LIMA to the  LAD vein graft to the OM1, vein graft to diagonal I and a vein graft to  the PDA. He is also status post mitral valve annuloplasty ring. The EF  has been 10-15% in the past but by echocardiogram in June 2007 shows EF  of 40-45%.  He is status post St. Jude dual-chamber ICD, chronic kidney  disease, hypertension, hyperlipidemia, type 2 diabetes, prostate cancer  with a history of radiation, gout, status post left wrist AV fistula  placement December 15, 2007, remote tobacco use.   LABORATORY DATA:  Admission weight 72.5 kg.  Last weight recorded in the  computer was on December 30, 2007 at 63.6 kg.  Admission H and H on the  second was 12.2 and 36.7, normal indices, platelets 156, WBCs 11.8. Last  CBC on December 31, 2007 showed H and H 11.5 and 34.4, normal indices,  platelets 142, WBCs 9.7. During this hospitalization his platelets did  drop down to 113 on the December 29, 2007.  Admission PTT 32, PT 16.4.  Sodium 141, potassium 3.9, BUN 80, creatinine 7.79, glucose 122.  Total  bilirubin slightly elevated at 1.7, phosphorus was 4.3.  On December 26, 2007 BUN and creatinine were 80 and 7.79. And on December 28, 2007 80 and  9.58.  On the December 31, 2007, BUN and creatinine were 39 and 7.29. Last  phosphorus was also on the December 30, 2007, and it was 6.6. Uric acid  was noted to be on December 30, 2007 at 6.6.  CK-MB, relative index, and  troponin were within normal limits on admission. His BNP on admission  was 2871.  TSH 2.095. On December 29, 2007 iron was 64, TIBC was low at  211, and percent sat of 30, ferritin 263. Hepatitis B surface antigen  was negative. Fluid from knee aspiration was noted to be cloudy. High  end WBCs and neutrophils and monocytes, macrophages. A type C screen on  December 30, 2007 was negative. Culture of knee aspiration did not show any  growth.  Urinalysis on admission was notable for 100 mg/dL of protein.   His EKG on December 25, 2007 showed normal sinus  rhythm, normal axis,  delayed R-wave, nonspecific ST-T wave changes, PVC. Any with chest x-ray  on the 2nd showed moderately severe CHF. On December 26, 2007 this showed  satisfactory placement of central venous hemodialysis catheter. No  pneumothorax. Mild improvement and changes.  Echocardiogram on December 25, 2007 showed EF of 25% with diffuse hypokinesis, inferior akinesis, mild  MR, right ventricular enlargement, and pacing wire.  Estimated PAT by TR  jet was 57.   HOSPITAL COURSE:  Dr. Lalla Brothers admitted the patient to [facility] with plans for diuresis.  However, renal was notified and asked  to consult given his increasing BUN and creatinine and anticipated  dialysis.  By the third IV Lasix had not improved his urine output or  symptoms. Zaroxolyn was added. Repeat echocardiogram was performed. Dr.  Shea Evans felt that the patient was also clinically uremic as well. His AV  fistula that was placed will not be ready for another 7-8 weeks. They  contacted [facility] for permanent cath  replacement. Felt he was stage V.  On  December 26, 2007 right IJ catheter was placed and hemodialysis was started  on December 27, 2007. Dr. Antoine Poche followed along, and given that all his  medications were held will consider resuming them based on blood  pressure and response to diuresis and hemodialysis.  He was accepted at  the [facility] for a Tuesday, Thursday, Saturday schedule and  was planned to start on January 02, 2008 at 10:30 to sign paperwork with  dialysis to follow. Over the next several days the patient tolerated the  dialysis well. Bilateral lower extremity Dopplers were performed and did  not show any evidence of DVT on December 28, 2007.  His right knee was  noted to be painful and orthopedic saw the patient in consultation.  Given his swollen right knee aspiration was performed.  It was felt that  this was related to gout.  On December 29, 2007 he complained of  constipation.  However, this also resolved. By December 30, 2007 the  patient's breathing had improved.  ACE inhibitor was added.  However,  renal discontinued this.  Orthopedics saw on and felt that the patient  had improved significantly and could follow up as needed with Dr.  Darrelyn Hillock. By December 31, 2007 Dr. Gala Romney felt that the patient had  improved with approximately 20 pounds of fluid loss and felt that he was  stable for potential discharge.  In the morning on January 01, 2008 Dr.  Antoine Poche felt that the patient could be discharged home if ambulating.  OT evaluated the patient and felt that there was no OT or DME identified  for ADL,  and the patient could be discharged home with his wife. PT  felt that a rolling walker was recommended. Case management assisted  with arranging this.   DISCHARGE DIAGNOSES:  1. Acute on chronic systolic congestive heart failure.  2. Known ischemic cardiomyopathy with an ejection fraction of      approximately 25% by echocardiogram on December 25, 2007.  3. Hypertension  (admission blood pressure was 204/103).  4. Chronic kidney disease, progressive to stage V with initiation of      hemodialysis during this admission.  5. Status post right intrajugular dialysis catheter.  6. Acute gout.  7. Thrombocytopenia without evidence of heparin-induced      thrombocytopenia.  8. History as listed below.   DISPOSITION:  The patient is discharged home.  Activity is not  restricted.  He was asked to maintain a renal diet for the renal  physicians.  New medications include aspirin 81 mg daily.  His  allopurinol was increased to 200 mg daily.  His metoprolol was decreased  to 50 mg q.h.s. He was asked to continue his Lipitor 80 mg q.h.s.,  Nephrovite daily, Hectorol 1 mcg Monday, Wednesday and Friday with  hemodialysis, and felodipine ER 10 mg daily.  He was asked to bring all  medications to all appointments, and he has an appointment tomorrow at  the [facility] to begin dialysis on Tuesday, Thursday and  Saturday.  He is asked to be there at 10:30.  Follow up with Dr. Darrelyn Hillock  as needed.  Dr. Jenene Slicker office will call him with a 67-month follow-up  appointment. Dr. Dagoberto Ligas he was asked to call for appointment for follow-  up in regards to primary care issues and sugars.  At the time of  discharge due to hypoglycemia he was asked to not take his Glucotrol.  He was also asked to stop taking Lasix, potassium, hydralazine and  isosorbide. His medications may be resumed if needed.   It is also noted that renal filled out the discharge sheet per them  which also includes Plendil 10 mcg daily, Percocet 5/325 mg p.r.n. but  not more than 4 times a day, glipizide 5 mg daily.  Phos-Lo 667 three  times a day with medications.   DISCHARGE TIME:  50 minutes.      Joellyn Rued, PA-C      Rollene Rotunda, MD, [facility]  Electronically Signed    EW/MEDQ  D:  01/01/2008  T:  01/01/2008  Job:  [id number]   cc:   Cecille Aver, M.D.  Rollene Rotunda, MD,  [facility]  [name], M.D.

## 2010-10-06 NOTE — Op Note (Signed)
NAME:  [name] [name], [name] [name] NO.:  [id number]   MEDICAL RECORD NO.:  [id number]          PATIENT TYPE:  AMB   LOCATION:  SDS                          FACILITY:  [facility]   PHYSICIAN:  Juleen China IV, MDDATE OF BIRTH:  07-12-1940   DATE OF PROCEDURE:  12/15/2007  DATE OF DISCHARGE:  12/15/2007                               OPERATIVE REPORT   PREOPERATIVE DIAGNOSIS:  Chronic kidney disease.   POSTOPERATIVE DIAGNOSIS:  Chronic kidney disease.   PROCEDURE PERFORMED:  Left wrist fistula.   ANESTHESIA:  MAC.   COMPLICATIONS:  None.   ESTIMATED BLOOD LOSS:  Minimal.   FINDINGS:  Vein dilated to 3.5 dilator.   PROCEDURE:  The patient was identified in the holding area and taken to  room where he was placed supine on the table.  A MAC anesthesia was  administered.  The patient was prepped and draped in standard sterile  fashion.  A time-out was called.  Antibiotics were given.  Cephalic vein  was identified with ultrasound and marked.  The radial arteries were  marked.  A transverse incision was made 2 fingerbreadths above the  radial head.  Cephalic vein was identified and encircled with a vessel  loop.  It had spasm significantly from it's appearance on ultrasound.  It was mobilized proximally and distally.  Next, the radial artery was  identified and circumferentially dissected free such that it could be  occluded proximally and distally.  The saphenous vein were ligated  between 3-0 silk ties. The distal end of vein was transected at the end  with 3-0 silk ties.  At this point in time, the patient was  systematically heparinized.  The radial arteries occluded with vascular  clamps.  A #11 blade used to make a longitudinal arteriotomy which was  extended with Potts scissors.  The vein was spatulated.  It was dilated  up to a 3.5 coronary dilator.  It was then flushed with heparinized  saline.  An end-to-side anastomosis was then created  using a running 7-  0  Prolene.  Prior to completion, the artery was flushed in antegrade and  retrograde fashion.  One repair stitch was required for hemostasis.  I  was able to Doppler a thrill up to the elbow.  There was a good signal  distal to the radial artery.  The wound was irrigated.  The subcutaneous  tissue was closed with 3-0 Vicryl, and skin was closed with 4-0 Vicryl.  Dermabond was then placed.  The patient taken to the holding area in  stable condition.  There were no complications.           ______________________________  V. Charlena Cross, MD  Electronically Signed     VWB/MEDQ  D:  12/15/2007  T:  12/16/2007  Job:  [id number]

## 2010-10-06 NOTE — Discharge Summary (Signed)
NAME:  [name], [name] NO.:  [id number]   MEDICAL RECORD NO.:  [id number]          PATIENT TYPE:  INP   LOCATION:  6742                         FACILITY:  [facility]   PHYSICIAN:  Rollene Rotunda, MD, FACCDATE OF BIRTH:  09/01/1940   DATE OF ADMISSION:  12/24/2007  DATE OF DISCHARGE:  01/02/2008                         DISCHARGE SUMMARY - REFERRING   ADDENDUM  This is a clarification of the pink sheet that the patient received.  According to the renal discharge instructions to the patient, the renal  team had informed the patient to begin taking Crestor 40 mg daily;  however, the patient was already on Lipitor 80 mg p.o. nightly, and this  was the only statin he went home on.  He did not receive a new  prescription for Crestor 40 mg p.o. daily and should not be taking this  medication.      Joellyn Rued, PA-C      Rollene Rotunda, MD, [facility]  Electronically Signed    EW/MEDQ  D:  02/02/2008  T:  02/02/2008  Job:  [id number]   cc:   Michel Harrow, M.D.  Ronald A. Darrelyn Hillock, M.D.  [facility]

## 2010-10-06 NOTE — Assessment & Plan Note (Signed)
[facility]                            CARDIOLOGY OFFICE NOTE   NAME:[name] [name], [name] [name]                       MRN:          [id number]  DATE:05/06/2008                            DOB:          [birth year]    PRIMARY CARE PHYSICIAN:  [name], MD   REASON FOR PRESENTATION:  Evaluate the patient with cardiomyopathy and  coronary disease.   HISTORY OF PRESENT ILLNESS:  The patient is a very pleasant 70 year old  African American gentleman with coronary disease and cardiomyopathy.  He  has done better since I last saw him.  He was hospitalized in August  with volume overload.  Since that time, he has had dialysis without  significant problems.  Occasionally, he will have low blood pressure  with this.  He will feel fatigued afterwards.  However, he is not having  any dyspnea, PND, or orthopnea.  He has not been having any palpitation,  presyncope, or syncope.  He denies any chest discomfort, neck or arm  discomfort.   PAST MEDICAL HISTORY:  1. Coronary artery disease (status post CABG in 2006 with a LIMA to      the LAD, vein graft to first obtuse marginal, vein graft to the      PDA, and vein graft to the diagonal).  2. Status post mitral valve repair with a #28 annuloplasty ring.  3. Ischemic myopathy (he has been 10-20% in the past, but was most      recently 40-45%).  4. Chronic kidney disease, dialysis dependant.  5. Diabetes mellitus.  6. Hypertension.  7. Gout.  8. Prostate cancer status post radiation.  9. Dyslipidemia.  10.ICD (placed on November 05, 2004, St. Jude single-chamber Atlas [id number]).   ALLERGIES:  None.   MEDICATIONS:  1. Allopurinol 200 mg daily.  2. Glipizide 2.5 mg daily.  3. Felodipine 10 mg daily.  4. Aspirin 81 mg daily.  5. Crestor 40 mg daily.  6. Toprol 50 mg nightly.  7. PhosLo.  8. Nephro-Vite.  9. Hectorol.  10.Colchicine 0.6 mg b.i.d. p.r.n.   REVIEW OF SYSTEMS:  As stated in the HPI and otherwise  negative for  other systems.   PHYSICAL EXAMINATION:  GENERAL:  The patient is in no distress.  VITAL SIGNS:  Blood pressure 167/89, heart 83 and regular, weight 133  pounds.  NECK:  No jugular venous distention at 45 degrees, carotid upstroke  brisk and symmetric, no bruits, no thyromegaly.  LUNGS:  Clear to auscultation bilaterally.  BACK:  No costovertebral angle tenderness.  CHEST:  Unremarkable.  HEART:  PMI not displaced or sustained, S1 and S2 within normal limits,  no S3, no S4, no clicks, no rubs, no murmurs.  ABDOMEN:  Flat, positive bowel sounds normal in frequency and pitch, no  bruits, no rebound, no guarding, no midline pulsatile mass, no  organomegaly.  SKIN:  No rashes, no nodules.  EXTREMITIES:  Pulses 2+, no edema, the left forearm has a dialysis graft  with positive thrill and bruit.  NEUROLOGIC:  Grossly intact.   EKG:  Sinus rhythm, rate 82, axis  within normal limits, intervals within  normal limits, T-wave inversions consistent with a strain pattern.   ASSESSMENT AND PLAN:  1. Cardiomyopathy.  The patient has cardiomyopathy as described.  He      is on his beta-blocker.  He is not on an ACE inhibitor with his      very labile blood pressure.  This had also been avoided in the past      with his renal insufficiency.  I am deferring any management of his      blood pressure issues, and therefore titration with the beta-      blocker to the renal doctors.  The most important part here now is      management of his volume with dialysis.  2. Hypertension.  Again, his blood pressure is elevated.  However, it      is labile and it drops with dialysis.  I will defer management to      his nephrologist.  3. Coronary disease.  He is having no ongoing angina.  No further      cardiovascular testing is suggested.  He will continue the      medications as listed.  4. Dyslipidemia per Dr. Dagoberto Ligas with a goal LDL less than 70 and HDL      greater than 40.  5. Status post  St. Jude Atlas implantable cardioverter-defibrillator.      This was interrogated today.  It is functioning normally.  There is      no pacing required.  He has had no intercurrent events.  He will      continue to be followed routinely in our Implantable Cardioverter-      Defibrillator Clinic.     Rollene Rotunda, MD, [facility]  Electronically Signed    JH/MedQ  DD: 05/06/2008  DT: 05/06/2008  Job #: [id number]   cc:   [name], M.D.

## 2010-10-06 NOTE — Op Note (Signed)
NAME:  [name] [name], [name] [name] NO.:  [id number]   MEDICAL RECORD NO.:  [id number]          PATIENT TYPE:  INP   LOCATION:  2909                         FACILITY:  [facility]   PHYSICIAN:  Larina Earthly, M.D.    DATE OF BIRTH:  1941/03/07   DATE OF PROCEDURE:  12/26/2007  DATE OF DISCHARGE:                               OPERATIVE REPORT   PREOPERATIVE DIAGNOSIS:  End-stage renal disease.   POSTOPERATIVE DIAGNOSIS:  End-stage renal disease.   PROCEDURE:  Right internal jugular Diatek catheter placement.   SURGEON:  Larina Earthly, MD   ASSISTANT:  Nurse.   ANESTHESIA:  MAC.   COMPLICATIONS:  None.   DISPOSITION:  To recovery room, stable.   PROCEDURE IN DETAIL:  The patient was taken to operating room and placed  in supine position, where the area of the right and left neck were  visualized with ultrasound revealing a widely patent jugular vein.  The  patient did have a fibrillator placed in the left subclavian.  The  patient was placed in Trendelenburg position.  The right and left neck  and chest were prepped and draped in usual sterile fashion.  Using local  anesthesia and a fine needle, the right internal jugular vein was  identified.  Next, using a Seldinger technique, a guidewire was passed  down the level of the right atrium.  This was confirmed with  fluoroscopy.  A dilator and peel-away sheath were passed over the  guidewire, and the 28-cm Diatek catheter was positioned down the level  of the right atrium.  The peel-away sheath was removed.  The catheter  was brought to the subcutaneous tunnel through a separate stab incision.  Two-lumen ports were attached and both lumens were flushed and aspirated  easily and were locked with 1000 units per milliliter heparin.  The  catheter was secured to the skin with 3-0 nylon stitch and the entry  site was closed with a 4-0 subcuticular Vicryl stitch.  Sterile dressing  was applied.  The patient was taken to the recovery  room in stable  condition.      Larina Earthly, M.D.  Electronically Signed     TFE/MEDQ  D:  12/26/2007  T:  12/27/2007  Job:  [id number]

## 2010-10-06 NOTE — Op Note (Signed)
NAME:  [name] [name], [name] [name] NO.:  [id number]   MEDICAL RECORD NO.:  [id number]          PATIENT TYPE:  AMB   LOCATION:  SDS                          FACILITY:  [facility]   PHYSICIAN:  Juleen China IV, MDDATE OF BIRTH:  04-12-1941   DATE OF PROCEDURE:  05/31/2008  DATE OF DISCHARGE:  05/31/2008                               OPERATIVE REPORT   PREOPERATIVE DIAGNOSIS:  Nonmaturing left wrist fistula.   POSTOPERATIVE DIAGNOSIS:  Nonmaturing left wrist fistula.   PROCEDURES PERFORMED:  1. Ligation of a left wrist fistula.  2. Right upper arm AV fistula.   ANESTHESIA:  MAC.   BLOOD LOSS:  Minimal.   FINDINGS:  A 5-mm cephalic vein palpable, radial pulse after the  procedure.   INDICATIONS:  This is a 70 year old gentleman who had a left wrist  fistula placed.  This did not mature.  He also has a pacemaker and for  that reason I would like to switch arms to the right arm, as he did have  some swelling in the left.   PROCEDURE:  The patient was identified in the holding area and taken to  the room 6.  He was placed supine on table.  MAC anesthesia was  administered.  Both arms were prepped and draped in a standard sterile  fashion.  A time-out was called, antibiotics were given.  First began in  the left wrist.  A 1% lidocaine was used with local anesthesia.  Transverse incision was made just proximal to the previous incision.  Dissection was carried down to the cephalic vein.  It was encircled and  then ligated with 2-0 silk ties.  After ligation, there was no thrills  in the forearm and there was a palpable radial pulse distal to the  ligated vein.  A 3-0 Vicryl were used to reapproximate the subcutaneous  tissue and 4-0 Vicryl was used to close the skin.  Dermabond was then  placed.   Next, the attention was turned towards the right arm.  Ultrasound was  used to map the course of the cephalic vein.  A transverse incision was  made at the antecubital crease.  I  first dissected out the cephalic  vein.  There was a median cubital branch that was more prominent than  the cephalic vein below that; therefore, that is what I elected to use  for the fistula.  The cephalic vein branch was then ligated with 3-0  silk tie.  The vein was then mobilized until enough length and then  dissected free.  Next, the brachial artery was mobilized for distance of  approximately 2 cm.  At this point time, the patient was given systemic  heparinization.  After the heparin was circulated, the artery occluded.  The vein was transected at its distal end.  The distal end was ligated  with 2-0 silk tie.  The vein had been marked to maintain proper  orientation.  The vein was then flushed with heparinized saline.  We  measured it.  It was approximately 4-5 mm in diameter at adequate  caliber.  Next, arteriotomy  was made in brachial artery with #11 blade,  which is extended with Potts scissors.  The vein was then spatulated and  end-to-side anastomosis was created with a running 6-0 Prolene.  Prior  to completion of the anastomosis, the artery was flushed in antegrade  and retrograde fashion.  The anastomosis was then secured.  One repair  stitch was required for hemostasis.  The clamps was released, 50 mg  protamine were given.  The patient had a good thrill up to his shoulder  and the vein.  He had  palpable radial pulse.  Next, the wound was irrigated.  The deep tissue  was reapproximated with 3-0 Vicryl, and the skin closed with 4-0 Vicryl.  The patient tolerated the procedure well.  He was taken to the recovery  room in stable condition.  There were no complications.           ______________________________  V. Charlena Cross, MD  Electronically Signed     VWB/MEDQ  D:  05/31/2008  T:  05/31/2008  Job:  [id number]

## 2010-10-06 NOTE — Assessment & Plan Note (Signed)
OFFICE VISIT   Menges, [name] [name]  DOB:  1941-03-11                                       07/15/2008  NWGNF#:[id number]   REASON FOR VISIT:  Follow-up.   HISTORY:  This is a 70 year old gentleman who had failed attempts at  left-sided fistula access and on May 31, 2008, I performed a right  upper arm AV fistula.  He comes in today for follow-up.  On examination,  there is excellent thrill of his fistula, I think is maturing nicely.  I  think this will be a good conduit for dialysis access.  I would  recommend waiting a full 2 months before accessing this, I think it will  be ready for use early to mid March.   [name] Ny, MD  Electronically Signed   VWB/MEDQ  D:  07/15/2008  T:  07/16/2008  Job:  [id number]   cc:   [name] [name], M.D.  [name] [name], M.D.

## 2010-10-06 NOTE — Assessment & Plan Note (Signed)
[facility]                            CARDIOLOGY OFFICE NOTE   NAME:[name], [name] GRUSZKA                       MRN:          [id number]  DATE:02/05/2008                            DOB:          10/03/1940    PRIMARY CARE PHYSICIAN:  [name], MD   REASON FOR PRESENTATION:  Evaluate the patient with recent  hospitalization for congestive heart failure.   HISTORY OF PRESENT ILLNESS:  The patient presents after having been  hospitalized for about 9 days in August.  He had volume overload.  He  was also uremic.  He was put into the hospital and diuresed with  hemodialysis.  He subsequently did well.  He also had a gouty flare with  aspiration of his knee.  Since going home, the patient has done well.  He is tolerating dialysis.  He is off his rolling walker, which he was  using.  He has had no acute shortness of breath, denies any PND, or  orthopnea.  He has had no chest discomfort, neck or arm discomfort.  He  has had no palpitations, presyncope or syncope.  Of note, the patient is  on a lower dose antihypertensives than he was on.  He is not sure what  his blood pressure is running on dialysis.  Also of note, his Lipitor  was switched from 80 mg of Lipitor to 40 mg Crestor.  It is not clear to  me whether this was done in the hospital or later.   PAST MEDICAL HISTORY:  1. Coronary artery disease (he is status post CABG in 2006, with a      LIMA to the LAD, vein graft to first obtuse marginal, vein graft to      the PDA, and vein graft to the diagonal).  2. Status post mitral valve repair with #28 annuloplasty ring,      ischemic cardiomyopathy (he has been 10-20% in the past, most      recently 40-45%).  3. Chronic kidney disease, dialysis-dependent now.  4. Diabetes mellitus.  5. Hypertension.  6. Gout.  7. Prostate cancer status post radiation.  8. Dyslipidemia.   ALLERGIES:  None.   MEDICATIONS:  1. Allopurinol.  2. Felodipine 10 mg  daily.  3. Glipizide 5 mg daily.  4. Aspirin 81 mg daily.  5. Crestor 40 mg daily.  6. Metoprolol 50 mg daily.  7. PhosLo.  8. Nephro-Vite.  9. Hectorol.   REVIEW OF SYSTEMS:  As stated in the HPI and otherwise negative for  other systems.   PHYSICAL EXAMINATION:  GENERAL:  The patient is in no distress.  VITAL SIGNS:  Blood pressure 154/84, heart rate 99 and regular, weight  136 pounds, and body mass index 22.  HEENT:  Eyes are unremarkable, pupils are equal, round, react to light;  fundi not visualized.  Oral mucosa unremarkable.  NECK:  No jugular venous distension at 45 degrees, carotid upstroke  brisk and symmetrical.  No bruit.  No thyromegaly.  LYMPHATICS:  No adenopathy.  LUNGS:  Clear to auscultation bilaterally.  BACK:  No costovertebral  angle tenderness.  CHEST:  With a Port-a-Cath in his right upper chest.  Otherwise, well-  healed sternal scar and ICD pocket.  HEART:  PMI not displaced or sustained.  S1 and S2 are within normal  limits.  No S3.  No S4.  No clicks, no rubs, no murmurs.  ABDOMEN:  Flat, positive bowel sounds, normal in frequency and pitch.  No bruits, no rebound, no guarding.  No midline pulsatile mass.  No  hepatomegaly.  No splenomegaly.  SKIN:  No rashes.  No nodules.  EXTREMITIES:  A 2+ pulses.  Dialysis graft in his left forearm.  No  edema.  NEURO:  Oriented to person, place, and time.  Cranial nerve II through  XII grossly intact.  Motor grossly intact.   EKG sinus rhythm, rate 71, left axis deviation, poor anterior R-wave  progression, lateral T-wave inversions, and no change from previous  EKGs.   ASSESSMENT AND PLAN:  1. Coronary disease.  The patient is having no ongoing angina.  No      further cardiovascular testing is suggested.  He will continue with      risk reduction.  2. Cardiomyopathy.  He is tolerating his medications as listed.  His      volume is being managed at Dialysis.  At this point, he will      continue on the  current regimen though we may titrate medications      upward based on future blood pressures.  3. Hypertension.  Blood pressure is very slightly elevated.  We got      his medications reduced in the hospital.  I have asked him to get      recordings of his blood pressures from Dialysis.  I would be happy      to work with a nephrologist suggesting his medications, based on      his blood pressure readings at those times.  4. Dyslipidemia.  The patient was switched to Crestor 40 mg.  He had a      reasonable lipid profile on 80 mg of Lipitor.  I have asked him to      check which regimen is cheaper.  He is due to have labs drawn soon      by Dr. Dagoberto Ligas and he thinks this will include a fasting lipid      profile.  I would be happy to review this.  The goal should be an      LDL less than 70 an HDL greater      than 40.  5. Followup.  I will see the patient back in about 3 months or sooner      if needed.     Rollene Rotunda, MD, [facility]  Electronically Signed    JH/MedQ  DD: 02/05/2008  DT: 02/06/2008  Job #: [id number]   cc:   [name], M.D.

## 2010-10-09 NOTE — Procedures (Signed)
NAME:  [name] [name], [name] [name] NO.:  [id number]   MEDICAL RECORD NO.:  [id number]          PATIENT TYPE:  OUT   LOCATION:  SLEEP CENTER                 FACILITY:  [facility]   PHYSICIAN:  Marcelyn Bruins, M.D. [facility] DATE OF BIRTH:  May 07, 1941   DATE OF STUDY:  11/29/2005                              NOCTURNAL POLYSOMNOGRAM    .  REFERRING PHYSICIAN:  Rollene Rotunda, M.D.   INDICATIONS FOR STUDY:  Hypersomnia with sleep apnea.   SLEEP ARCHITECTURE:  The patient had total sleep time of 276 minutes with  never achieved slow wave sleep.  Sleep onset latency was normal and REM  onset was very prolonged at 148 minutes.  Sleep efficiency was very  decreased at 58%.   RESPIRATORY DATA:  The patient was found to have 179 obstructive and central  events in the first 138 minutes of sleep.  The events were not positional  but there was moderate snoring noted throughout.  By protocol, the patient  was then placed on a medium Respironics Comfort Gel nasal mask and CPAP  titration was initiated.  The patient was titrated up, however, I believe  that a lot of his obstructive events toward the end of the study were  actually central apneas induced by CPAP pressure.  The CPAP titration was  difficult because the patient had a heart time getting back to sleep once  the pressure was initiated.   OXYGEN DATA:  The patient had no events.   MOVEMENT/PARASOMNIAS:  Small numbers of leg jerks with no clinically  significant sleep disruption.  Cardiac ectopics were noted throughout.   IMPRESSION/RECOMMENDATIONS:  1.  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  The patient was then placed on CPAP and at an      optimal pressure of 11 cm, seemed to have fairly good control of his      obstructive events.  The patient was difficult to titrate because of      sleep maintenance once  the pressure was started.  Also, because of the      presence of the central apneas, I would initiate the patient on a CPAP      pressure of 11, however, he may need to the lab for all night formal      titration because of the complex nature of his sleep disordered      breathing if he continues to be      symptomatic.  2.  Frequent ventricular ectopics noted throughout the study.         ______________________________  Marcelyn Bruins, M.D. [facility]  Diplomate, American Board of Sleep  Medicine    KC/MEDQ  D:  12/14/2005 15:30:13  T:  12/14/2005 18:52:57  Job:  [id number]

## 2010-10-09 NOTE — Discharge Summary (Signed)
NAME:  [name] [name], [name] [name] NO.:  [id number]   MEDICAL RECORD NO.:  [id number]          PATIENT TYPE:  INP   LOCATION:  4711                         FACILITY:  [facility]   PHYSICIAN:  Maple Mirza, P.A. DATE OF BIRTH:  March 19, 1941   DATE OF ADMISSION:  10/27/2004  DATE OF DISCHARGE:  10/30/2004                                 DISCHARGE SUMMARY   DISCHARGE DIAGNOSES:  1.  Admitted with exacerbation of congestive heart failure.  BNP was greater      than 3200 on admission.  The patient decompensated class II heart      failure, went to class III congestive heart failure.  (The patient lost      8+ pounds on IV Lasix diuresis over a 72-hour period).  2.  Ischemic cardiomyopathy, ejection fraction 10-20% on echocardiogram,      October 28, 2004.  3.  The patient was ruled out for acute myocardial infarction by troponin I      studies that 0.01 to 0.02, then 0.01.  History of non-Q wave myocardial      infarction in March 2005.  At that time, Cardiolite only study, no cath      secondary to severe renal insufficiency.  4.  Nonsustained ventricular tachycardia this admission.  5.  Chronic renal insufficiency.  Admission creatinine 3.2.  On June 9, it      was 3.0.  6.  Severe three vessel coronary artery disease status post coronary artery      bypass graft surgery with mitral valve annuloplasty August 31, 2004.  7.  Post coronary artery bypass graft surgery, atrial fibrillation, now in      sinus rhythm.  8.  When fully compensated, the patient can walk 35 minutes without stopping      to rest.  He has a narrow QRS 85 ms.   SECONDARY DIAGNOSES:  1.  Hypertension.  2.  Dyslipidemia.  3.  Gout.  4.  Chronic Coumadin with supratherapeutic INR this admission.  5.  History of prostate cancer status post seed implant.  6.  Bilateral knee arthroplasties.   PROCEDURES:  Echocardiogram which was done October 28, 2004 showed ejection  fraction 10-20%, only mild mitral valvular  regurgitation.  Left atrium  mildly to moderately dilated.   PLAN:  1.  The patient will discharge probably October 30, 2004.  He will return for      implantation of cardioverter defibrillator on June 15 by Dr. Sherryl Manges.  2.  The patient is asked to hold off on taking Coumadin until he has his      implantation and also to hold off on taking Actos as this is a pro-      volume overload drug.   The patient will discharge on:  1.  Avapro 150 mg twice daily.  2.  Allopurinol 100 mg daily.  3.  Glucotrol 5 mg twice daily.  4.  Lasix 40 mg twice daily.  5.  Potassium chloride 20 mEq twice daily.  6.  Hectorol 0.5 mcg daily.  7.  Lipitor 80 mg daily at bedtime.  8.  Toprol 150 mg daily.   BRIEF HISTORY:  [name] [name] is a 70 year old male.  He is recently status post  coronary artery bypass graft surgery with mitral valve annuloplasty in April  of this year.  He presented to [facility] on June 6 after a week of  progressive shortness of breath with fatigue.  He denies chest discomfort.  He denies lower extremity edema.  He has no paroxysmal nocturnal dyspnea or  orthopnea.  The patient's wife reports he has been quite lethargic and  difficult to spur to activity in the last week.  His blood pressure has been  mildly labile.  After his coronary artery bypass graft surgery, he did well  postoperatively until about a week ago, when he noticed he was having more  shortness of breath.  Usually, he can walk 35 minutes without stopping and  he walks up and down the road where he lives.  He now comes to the emergency  room and is found to be in pulmonary edema with a BNP greater than 3200.  His past medical history is significant for congestive heart failure.  He  underwent heart catheterization in April 2006 and this showed 50% left main  stenosis, 90% LAD, 100% circumflex and 95% right coronary artery stenosis  with ejection fraction 29%.  He was referred for bypass surgery which  was  undertaken August 31, 2004.  The grafts there were LIMA to the LAD, saphenous  vein graft to the diagonal, saphenous vein graft to the obtuse marginal,  saphenous vein graft to the posterior descending coronary artery.  He was  also noted to have severe mitral regurgitation and this was fixed with a 28  mm Seguin ring.  He has done well since then, except for this one week of  progressive dyspnea.  He has chronic renal insufficiency with a baseline  creatinine of 2.8.  It is 3.2 on admission.   HOSPITAL COURSE:  The patient presented with exacerbation of congestive  heart failure, pulmonary edema, elevated BNP on June 6.  He was started on  IV Lasix and for the next 72 hours aggressively diuresed with a loss of 8+  pounds.  His normally compensated class II congestive heart failure had  progressed to class III congestive heart failure with dyspnea and lethargy.  He was ruled out for myocardial infarction by serial troponin I studies.  Echocardiogram showed further decrease in ejection fraction to 10-20% and  ischemic cardiomyopathy.  The patient has narrow QRS and does well when he  is in compensated form.  He will continue on his medications that he had on  presentation, except that he will hold his Coumadin since it is  supratherapeutic and we are trying him off Actos for a trial run to see if  this helps with volume overload.  He is seen in consultation by Dr. Sherryl Manges because he clearly is a candidate for cardioverter defibrillator under  the MADIT II criteria, if not under the SCD-HeFT criteria.  The patient is  discharging on June 9.  Will present to [facility] on June 15 for  cardioverter defibrillator implantation.   LABORATORY STUDIES ON JUNE 9:  BNP was 1380.  Complete blood counts:  White  cells 5.2, hemoglobin 10.9, hematocrit 32.5, platelets 192,000.  Serum electrolytes:  Sodium 143, potassium 3.8, chloride 109, carbonate 26, BUN of  27, creatinine 3,  glucose 161.  GM/MEDQ  D:  10/30/2004  T:  10/31/2004  Job:  [id number]   cc:   [name], M.D.  [address], [address]  [place]  [place] [address]  Fax: [phone]   Duke Salvia, M.D.   Rollene Rotunda, M.D.   Cecille Aver, M.D.  [address]  [place]  [place] [address]  Fax: [phone]

## 2010-10-09 NOTE — Op Note (Signed)
NAME:  [name] [name], [name] [name] NO.:  [id number]   MEDICAL RECORD NO.:  [id number]                   PATIENT TYPE:  AMB   LOCATION:  DAY                                  FACILITY:  [facility]   PHYSICIAN:  Georges Lynch. Darrelyn Hillock, M.D.             DATE OF BIRTH:  [birth year]   DATE OF PROCEDURE:  06/01/2002  DATE OF DISCHARGE:                                 OPERATIVE REPORT   SURGEON:  Georges Lynch. Darrelyn Hillock, M.D.   ASSISTANT:  Nurse.   PREOPERATIVE DIAGNOSES:  1. Chondrocalcinosis of the left knee.  2. Degenerative tear of the medial meniscus, left knee.   POSTOPERATIVE DIAGNOSES:  1. Chondrocalcinosis of the left knee.  2. Degenerative tear of the medial meniscus, left knee.   OPERATION:  1. Diagnostic arthroscopy, left knee.  2. Partial medial meniscectomy, left knee.  3. Abrasion chondroplasty of the medial femoral condyle, left knee.  4. Abrasion chondroplasty of the patella, left knee.  5. Synovectomy of suprapatellar pouch, left knee.   DESCRIPTION OF PROCEDURE:  Under local anesthesia with standby, a routine  orthopedic prep and draping of the left knee was carried out.  A small  punctate incision was made in the lateral joint space, superiorly, and the  inflow cannula was inserted, and the knee was distended with saline.  Another small punctate incision was made in the anterolateral joint.  The  arthroscope was entered, and a complete diagnostic arthroscopy was carried  out.  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  I introduced the shaver suction  device in the medial approach and did an abrasion chondroplasty of the  patella and did a synovectomy in the suprapatellar pouch.  The lateral joint  space was clear.  ACL and posterior and inferior cruciate ligaments were  intact.  Medial joint space, he had significant degenerative changes in the  medial femoral condyle, so I did an abrasion  chondroplasty in the medial  femoral condyle and then went back posteriorly to the posterior horn of the  medial meniscus.  I did a partial medial meniscectomy.  I thoroughly  irrigated out the knee, removed all the  fluid, closed all three punctate incisions with 3-0 nylon suture.  Sterile  Neosporin dressing was applied after I injected 30 cubic centimeter of 0.5%  Marcaine and epinephrine into the knee joint.  Bundle dressing was applied.  The patient left the operating room in satisfactory condition.  The patient  had 1 g of IV Ancef preop.                                               Ronald A. Darrelyn Hillock, M.D.    RAG/MEDQ  D:  06/01/2002  T:  06/01/2002  Job:  [id number]   cc:   [name], M.D.  [address]  [place]  [place] [address]  Fax: [phone]

## 2010-10-09 NOTE — Assessment & Plan Note (Signed)
[facility]                            CARDIOLOGY OFFICE NOTE   NAME:[name] [name], [name] [name]                       MRN:          [id number]  DATE:05/10/2006                            DOB:          Apr 28, 1941    PRIMARY CARE PHYSICIAN:  Dr. Anson Fret.   REASON FOR PRESENTATION:  Evaluate patient with ischemic cardiomyopathy.   HISTORY OF PRESENT ILLNESS:  Patient is now 70 years old.  He has done  very well since I last saw him.  Of note, this summer we did an  echocardiogram which demonstrated that his ejection fraction which had  been 10-20% was now up to 40-45%!  He had a stable mitral annuloplasty  ring.  He says he has done very well since the last visit.  He is not  exercising as much as I would like.  He is not watching his diabetes as  closely.  Despite this lack of attention he has done well.  He denies  any chest pain or shortness of breath.  He has had no palpitations, no  presyncope or syncope.  He has had no PND or orthopnea.  He is wearing a  CPAP and being followed by Dr. Shelle Iron.  He has his chronic renal  insufficiency followed by Dr. Kathrene Bongo and apparently this is  stable.   PAST MEDICAL HISTORY:  Coronary artery disease (status post CABG in  April 2006 with a LIMA to the LAD, SVG to first obtuse marginal, SVG to  PDA, SVG to diagonal), mitral valve repair with a 28-mm Sequin  annuloplasty ring, ischemic cardiomyopathy (EF had been 10-20%, now 40-  45%), chronic renal insufficiency, diabetes mellitus, hypertension,  gout, prostate caner status post radiation in plane, dyslipidemia.   ALLERGIES:  NONE.   CURRENT MEDICATIONS:  1. Glucotrol 5 mg b.i.d.  2. Potassium 20 mEq q.d.  3. Lipitor 80 mg q.d.  4. Allopurinol 100 mg q.o.d.  5. Felodipine 10 mg daily.  6. BiDil 2 tablets t.i.d.  7. Metoprolol 100 mg q.d.  8. Calcitriol.   REVIEW OF SYSTEMS:  As states in the history of present illness,  otherwise negative for other  systems.   PHYSICAL EXAMINATION:  Patient is in no distress with blood pressure  140/78, heart rate 58 and regular, weight 166 pounds, body mass index  24.  HEENT:  Eyes unremarkable, pupils equal, round, reactive to light, fundi  not visualized.  NECK:  No jugular venous distention 45 degrees, carotid upstroke brisk  and symmetrical, no bruits, no thyromegaly.  LYMPHATICS:  Are normal.  LUNGS:  Clear to auscultation bilaterally.  BACK: No costovertebral angle tenderness.  CHEST:  Well healed sternotomy scar and ICD pocket.  HEART:  PMI not displaced or sustained, S1 and S2 within normal limits,  no S3, no S4, No murmurs.  ABDOMEN:  Flat, positive surgical scars well healed, no rebound or  guarding.  SKIN:  No rashes, nodules.  EXTREMITIES:  With 2+ pulses, no edema.  EKG:  Sinus rhythm, rate 58, leftward axis, poor anterior R wave  progression, anterolateral T wave inversions not  changed from previous  EKGs.   ASSESSMENT/PLAN:  1. Coronary artery disease.  Patient is doing well, I expected this.      I encouraged more aggressive secondary risk reduction and hopefully      he will participate with this.  2. Cardiomyopathy.  His ejection fraction is improved.  This is      probably related to surgery, medications, and his CPAP.  I will      make no changes to his regimen.  3. Hypertension.  He is still at the upper limits of normal.      Hopefully with exercise and a little weight loss he will come down      to the 120s/70s which would be goal.  4. Risk reduction.  I will defer followup of his lipids to Dr. Dagoberto Ligas      unless requested otherwise.  Goal would be an LDL less than 70, and      an HDL in the 50s.  5. Follow up.  I would like to see him back in 6 months, and probably      yearly thereafter if he is doing well.     Rollene Rotunda, MD, [facility]  Electronically Signed    JH/MedQ  DD: 05/10/2006  DT: 05/10/2006  Job #: [id number]   cc:   [name], M.D.

## 2010-10-09 NOTE — Consult Note (Signed)
NAME:  [name] [name], [name] [name] NO.:  [id number]   MEDICAL RECORD NO.:  [id number]                   PATIENT TYPE:  INP   LOCATION:  0353                                 FACILITY:  [facility]   PHYSICIAN:  Maree Krabbe, M.D.             DATE OF BIRTH:  Jul 01, 1940   DATE OF CONSULTATION:  DATE OF DISCHARGE:                                   CONSULTATION   REASON FOR CONSULTATION:  Elevated creatinine with need for heart  catheterization.   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 He does have longstanding diabetes,  hypertension, and chronic renal insufficiency followed by Dr. Annie Sable of [facility].  He had been doing well, active,  and playing golf until the day of admission, August 16, 2003, when he  developed substernal chest discomfort with radiation to the jaw. At that  time he arrived to the emergency room, the pain had mostly eased and he did  have some slight T-wave inversions. His cardiac enzymes have returned  positive with troponin level of 1.48 and then 4.00. Creatinine is 2.7. He  has not had any shortness of breath, orthopnea, or PND.   PAST MEDICAL HISTORY:  1. Prostate cancer, status post radioactive seed implantation.  2. Chronic renal failure.  3. Hypertension for 30 years.  4. Diabetes mellitus times 20 years. No history of retinopathy.   PAST SURGICAL HISTORY:  Radiation seed implant for prostate allergy.   ALLERGIES:  None.   MEDICATIONS:  1. Allopurinol 100 mg daily.  2. Actos 45 mg daily.  3. Lasix 40 mg daily.  4. Diltiazem 240 mg daily.  5. Diovan 320 mg daily.  6. Propranolol 80 daily.  7. Glipizide 10 daily.  8. Lipitor 80 daily.   SOCIAL HISTORY:  The patient quit smoking in 1987.  He is married, has two  children, and two grandchildren. He is a retired Emergency planning/management officer.   FAMILY HISTORY:  Noncontributory for renal disease.   REVIEW OF SYSTEMS:  GENERAL: Denies fever, chills, nightsweats, weight loss.  HEENT: Denies hearing loss, visual changes, difficulty swallowing.  CARDIORESPIRATORY: As above. GI: Denies nausea, vomiting, abdominal pain, or  blood stools. GU: Denies difficulty voiding, incomplete voiding, hesitation,  urgency, dysuria. MUSCULOSKELETAL:  Denies arthralgias, myalgias, back pain.  NEUROLOGIC: Denies focal numbness, weakness, history of stroke, TIA, or  seizure. ENDOCRINE: Denies polydipsia, polyuria, heat or cold intolerance.  HEMATOLOGIC: Denies any history of bruising, epistaxis, or bleeding.   PHYSICAL EXAMINATION:  VITAL SIGNS: Blood pressure is 180/90, temperature  98.3, pulse 76, respirations 20, and oxygen saturation 100%.  GENERAL: This is a well-developed, pleasant male in no acute distress.  SKIN: Without rash.  HEENT: PERRLA. EOMI. Throat is clear.  NECK: Supple with no JVD.  CHEST: Clear.  CARDIAC: Regular rate and rhythm without murmurs or gallops.  ABDOMEN: Soft  and nontender with no organomegaly.  EXTREMITIES: No peripheral edema. Good distal perfusion with good pulses in  the feet. No carotid bruits or femoral bruits. No abdominal bruits.  NEUROLOGIC: No focal deficits.   LABORATORY DATA:  White count 7000, hemoglobin 13, platelet count 141,000.  Troponin 4.82, CPK 235, relative index of 4.6.  BNP 295.  INR 4.9. Sodium  139, potassium 3.5, CO2 27, BUN 28, creatinine 2.7, relative index normal,  albumin 3.3, calcium 8.8.   Chest x-ray reveals no acute disease. Renal ultrasound from year ago showed  9.5 cm kidneys which were echogenic without hydronephrosis.   IMPRESSION:  1. Chronic renal insufficiency probably due to hypertension and possibly     diabetic nephropathy.  2. Type 2 diabetes mellitus of 20 years duration. No history of known     retinopathy.  3. Chest pain with positive cardiac enzymes. Probably need heart     catheterization.  4. Hypertension on blood  pressure medications and diuretic.   RECOMMENDATIONS:  The patient is at moderate risk for transient renal  dysfunction after IV contrast and had somewhat lower risk by less 5% to 10%  for permanent renal failure in the setting. Would recommend pre-cath  treatment with: (1) Mucomyst 600 mg b.i.d. 24 hours pre-cath; (2) sodium  bicarbonate intravenous fluids per protocol; (3) hold ARB and diuretic 24  hours pre-cath; (4) limit diet, avoid left ventriculogram. Will follow with  you. Thank you for the consultation.                                               Maree Krabbe, M.D.    RDS/MEDQ  D:  08/17/2003  T:  08/17/2003  Job:  [id number]   cc:   Rollene Rotunda, M.D.

## 2010-10-09 NOTE — Cardiovascular Report (Signed)
NAME:  [name] [name], [name] [name] NO.:  [id number]   MEDICAL RECORD NO.:  [id number]          PATIENT TYPE:  INP   LOCATION:  3709                         FACILITY:  [facility]   PHYSICIAN:  Carole Binning, M.D. LHCDATE OF BIRTH:  01/27/1941   DATE OF PROCEDURE:  08/26/2004  DATE OF DISCHARGE:                              CARDIAC CATHETERIZATION   PROCEDURE PERFORMED:  Right and left heart catheterization with coronary  angiography.   INDICATIONS:  The patient is a 70 year old male with multiple medical  problems including diabetes mellitus and chronic renal insufficiency.  He  has a history of previous myocardial infarctions and secondary ischemic  heart disease.  He has also recently been found have severe mitral  regurgitation by echocardiogram with an ejection fraction of approximately  40%.  He was referred for right and left heart catheterization to assess his  hemodynamics and coronary anatomy.  The risks of contrast-induced  nephropathy were discussed at length with the patient and nephrology has  been involved.  There is an understanding that the patient may end up  requiring dialysis at some point in the near future.   PROCEDURAL NOTE:  A 7-French sheath was placed in the right femoral vein and  a 6-French sheath in the right femoral artery.  Right heart catheterization  was performed a Swan-Ganz catheter.  Left ventricular pressure was measured  with an angled pigtail catheter.  Coronary angiography was performed with  standard Judkins 6-French catheters.  Contrast was Visipaque.  At the  conclusion of procedure, a 6-French AngioSeal vascular closure device was  placed in the right femoral artery with good hemostasis.  There were no  complications.   HEMODYNAMIC RESULTS:  Right atrial pressure mean of 12, right ventricular  pressure of 76/13.  Pulmonary artery pressure of 76/40. Pulmonary capillary  wedge mean pressure of 37.  Left ventricular pressure 154/32.   Aortic  pressure 152/84.  There is not a significant gradient across the aortic  valve on catheter pullback.   Cardiac output by the thermodilution method is 4.0; cardiac index is 2.6.  Cardiac output by the Fick method is 4.7, cardiac index 2.6.   LEFT VENTRICULOGRAPHY:  Left ventriculography was not performed because of  the patient's renal insufficiency.   CORONARY ARTERIOGRAPHY:  Left main has a tubular 50% stenosis which is very  eccentric.  There is mild ventricularization of pressure waveform with  catheter engagement.   Left anterior descending artery has a 20% stenosis in the proximal vessel, a  diffuse 30% to 40% stenosis in the mid-vessel, a diffuse 90% stenosis in the  distal vessel.  The very apical LAD is 100% occluded.  The LAD gives rise to  fairly large first diagonal branch with a long 90% stenosis proximally.  There are small second and third diagonal branches.   The left circumflex has a 20% stenosis in the mid-vessel.  The distal  circumflex is 100% occluded just after a large first obtuse marginal branch.  The first obtuse marginal branch itself has a 90% stenosis proximally.  There is a second obtuse marginal branch  which is moderate in size which  fills via right-to-left collaterals.   Right coronary has a 50% stenosis in the mid-vessel.  It ends as a normal-to-  large posterior descending artery.  There is an 80% stenosis in the proximal  posterior descending artery followed by a 95% stenosis in the midportion of  posterior descending artery.  The very apical portion of posterior  descending artery is occluded with faint filling of the more apical vessel  by right-to-right collaterals.   IMPRESSION:  1.  Elevated right and left heart filling pressures with severe pulmonary      hypertension.  2.  Diffuse three-vessel coronary artery disease.  3.  Ischemic cardiomyopathy with probable ischemic mitral regurgitation as      assessed by echocardiogram.    PLAN:  The patient be referred for evaluation for a CABG and mitral valve  repair.      MWP/MEDQ  D:  08/26/2004  T:  08/26/2004  Job:  [id number]   cc:   [name], M.D.  [address], [address]  [place]  [place] [address]  Fax: [phone]   Rollene Rotunda, M.D.

## 2010-10-09 NOTE — Op Note (Signed)
NAME:  [name] [name], [name] [name] NO.:  [id number]   MEDICAL RECORD NO.:  [id number]          PATIENT TYPE:  INP   LOCATION:  2899                         FACILITY:  [facility]   PHYSICIAN:  Duke Salvia, M.D.  DATE OF BIRTH:  [birth year]   DATE OF PROCEDURE:  11/05/2004  DATE OF DISCHARGE:                                 OPERATIVE REPORT   PREOPERATIVE DIAGNOSES:  1.  Ischemic cardiomyopathy.  2.  Previous myocardial infarction.  3.  Prior bypass surgery.  4.  Depression left ventricular function.  5.  Renal insufficiency.   POSTOPERATIVE DIAGNOSES:  1.  Ischemic cardiomyopathy.  2.  Previous myocardial infarction.  3.  Prior bypass surgery.  4.  Depression left ventricular function.  5.  Renal insufficiency.   OPERATION/PROCEDURE:  Single-chamber defibrillator implantation with  intraoperative defibrillation threshold testing.   OPERATION/PROCEDURE:  Following the attainment of informed consent, the  patient was brought to the electrophysiology laboratory and placed on the  fluoroscopic table in the supine position.  After routine prep and drape of  the left upper chest, lidocaine was infiltrated in the prepectoral  subclavicular region.  An incision was made and carried down to the layer of  the prepectoral fascia using electrocautery and sharp dissection.  A pocket  was formed similarly.  Hemostasis was obtained.   Thereafter attention was turned to the extrathoracic left subclavian vein  which was accomplished without difficulty and without the aspiration of air  or puncture to the artery.  A guidewire was placed.  A 7-French sheath was  then placed  through which was passed a [employer] Jude Riata 65 cm dual-coil  __________ defibrillator lead, serial number [id number].  Under fluoroscopic  guidance, it was manipulated to the right ventricular apex where the bipolar  R wave was 8.9 MV with a pace impedance of 629 ohms and a threshold of 0.5  volts at 0.5 msec. There was  no documented pacing at 10 V.   A lead was then attached to a [employer] Jude Atlas Plus [id number] ICD, serial number  [id number] and through the device a bipolar R wave was 8.9 with impedance of 490  and a threshold of 0.5 V at 0.5 msec.  The high voltage impedance was 30  ohms.   With these acceptable parameters, the lead and the pulse generator were  placed in the pocket, the pocket previously having copiously irrigated with  antibiotic-containing saline solution and hemostasis having been assured.  The device was secured to the prepectoral fascia.  The wound was then closed  in three layers in the normal fashion.  The wound was washed, dried and  Benzoin and Steri-Strips were applied. Needle counts, sponge counts,  instrument counts were correct at the end of the procedure according to the  staff.   Following insertion of the device, defibrillator threshold testing was  undertaken a little bit out of order.  Ventricular fibrillation was induced  through T wave shock occurred after a total duration of 12 seconds.  A 15-  joule shock was delivered through a measured resistance of 33 ohms,  terminating ventricular fibrillation and restoring sinus rhythm.   After a wait of four to five minutes, ventricular fibrillation was reinduced  through the T wave shock.  After a total duration of 12 seconds, a 15-joule  shock was delivered through a measured resistance of 33 ohms, terminating  ventricular fibrillation and restoring sinus rhythm.  At this point the  patient received Romazicon and awakened.   The patient tolerated the procedure without apparent complications.       SCK/MEDQ  D:  11/05/2004  T:  11/05/2004  Job:  [id number]   cc:   Electrophysiology Laboratory   [facility]

## 2010-10-09 NOTE — Op Note (Signed)
NAME:  [name] [name], [name] [name] NO.:  [id number]   MEDICAL RECORD NO.:  [id number]          PATIENT TYPE:  INP   LOCATION:  2314                         FACILITY:  [facility]   PHYSICIAN:  Salvatore Decent. 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   OPERATION PERFORMED:  Median sternotomy, extracorporeal circulation,  coronary artery bypass grafting times four (left internal mammary artery to  left anterior descending, saphenous vein graft to first diagonal, saphenous  vein graft to obtuse marginal 1, saphenous vein graft to posterior  descending), mitral valve repair with a 28 m  Seguin annuloplasty ring.   SURGEON:  Salvatore Decent. Dorris Fetch, M.D.   ASSISTANT:  Jerold Coombe, P.A.   ANESTHESIA:  General.   FINDINGS:  Perbypass transesophageal echocardiography revealed severe mitral  regurgitation, no residual regurgitation post bypass; global left  ventricular hypokinesis, plaque at junction of arch and descending thoracic  aorta, no obvious plaque seen by epiaortic ultrasound.  Diffusely diseased  poor quality coronaries, good quality conduits.   INDICATIONS FOR PROCEDURE:  Mr.  [name] is a 70 year old gentleman with  multiple medical problems including a past history of cardiac disease  including myocardial infarction, approximately a year ago and chronic renal  insufficiency approaching end stage renal failure.  The patient presented  with congestive heart failure.  This was progressive class 3 symptoms.  He  was seen in the office by Dr. Antoine Poche, who performed echocardiogram which  revealed moderate to severe  mitral regurgitation and severe left ventricular  dysfunction and a Cardiolite which showed an ejection fraction of 29% but no  definite ischemia.  He subsequently underwent cardiac catheterization which  revealed severe three-vessel coronary disease.  Right heart catheterization  revealed poor cardiac function and elevated pulmonary arterial pressures.  The patient was referred for consideration for bypass grafting and mitral  valve repair or replacement.  The indications, risks, benefits and  alternatives were discussed in detail with the patient and he and his family  understood the high risk nature of the procedure, particularly given his  impending renal failure and the significant risk of needing postoperative  dialysis.  The patient understood and accepted the risks and agreed to  proceed.   DESCRIPTION OF PROCEDURE:  [name] was brought to the preop holding area on  August 31, 2004.  There the anesthesia service placed lines to monitor  arterial, central venous and pulmonary arterial pressure.  EKG leads were  placed for continuous telemetry.  Intravenous antibiotics were administered.  The patient was taken to the operating room, anesthetized and intubated.  A  Foley catheter was placed.  Transesophageal echocardiography was performed  which revealed global hypokinesis and severe mitral regurgitation.  Also  noted on the TEE was significant plaquing in the aorta which possible mobile  plaque  at the junction of the aortic arch and descending thoracic aorta.  No  significant plaque was seen within the ascending aorta by TEE.  The chest,  abdomen and legs were prepped and draped in the usual fashion.  A median  sternotomy was performed and the left internal mammary artery was harvested  using standard technique.  Simultaneously, an incision was made in the  medial aspect of the right leg and the greater saphenous vein was harvested  endoscopically from the right thigh.  A small  segment was harvested in open  fashion from the upper right calf.  5000 units of heparin was administered  during the vessel harvest.  The remainder of the full heparin dose was given  prior to dividing the distal end of the mammary artery.   The pericardium was opened.  Epiaortic ultrasound was performed and no  obvious mobile plaque was noted in the ascending aorta.  The aorta was  cannulated via concentric 2-0 Ethibond pledgeted pursestring sutures.  A 31  French right angle cannula was placed via pursestring suture in the superior  vena cava.  Cardiopulmonary bypass was instituted and the patient was cooled  to 28 degrees Celsius.  A 40 French right angle plastic tip cannula was  placed via pursestring suture in the inferior aspect of the right atrium and  directed into the inferior vena cava.  Umbilical tapes were placed around  both cavae but only tightened during the mitral portion of the procedure.  In fact the inferior caval tape was never tightened during the procedure.  The coronary arteries were inspected and anastomotic sites were chosen.  The  conduits were inspected and cut to length.  A foam pad was placed in the  pericardium to protect the left phrenic nerve.  A temperature probe was  placed in the myocardial septum and a retrograde cardioplegia cannula was  placed through a pursestring suture in the right atrium and directed into  the coronary sinus.  The antegrade cardioplegia was placed in the ascending  aorta.   The aorta was cross-clamped.  The left ventricle was emptied via the aortic  root vent.  Cardiac arrest then was achieved with a combination of cold  antegrade and retrograde blood cardioplegia and topical iced saline.  After  achieving a complete diastolic arrest and myocardial septal cooling, the  following distal anastomoses were performed.   First a reversed saphenous vein graft was placed end-to-side to the posterior descending branch of the right  coronary.  This was a 1.5 mm poor  quality target.  The vein was good quality, the anastomosis was performed  with a running 7-0 Prolene suture.  The anastomosis was probed proximally  and distally to ensure patency as were all the remaining grafts at their  completion.  Cardioplegia was administered.  There was good flow and good  hemostasis.   Next, a reversed saphenous vein graft was placed end-to-side to the obtuse  marginal 1. This was fair quality target  1.5 mm in diameter.  The vein was  of good quality.  The anastomosis was performed with a running 7-0 Prolene  suture.  Additional cardioplegia was administered.   Next, a reversed saphenous vein graft was placed end-to-side to the first  diagonal branch of the LAD.  This was a 1.3 mm fair quality target.  The  vein again was anastomosed end-to-side with a running 7-0 Prolene suture.  There was adequate flow.  Cardioplegia was administered. There was good  hemostasis.  Next, the left internal mammary artery was brought through a window in the  pericardium and the distal  limb was spatulated.  It was anastomosed end-to-  side to the distal LAD.  The LAD was a 1.5 mm poor quality target that was  diffusely diseased both proximally and distal to the anastomosis.  The  mammary was a 2 mm good quality conduit.  The anastomosis was performed end-  to-side with a running 8-0 Prolene suture.  After completion of the mammary  to LAD anastomosis, the bulldog clamp was briefly removed and inspected for  hemostasis.  Immediate and rapid septal rewarming was noted.  The bulldog  clamp was replaced.  The mammary pedicle was tacked to the epicardial  surface of the heart with 6-0 Prolene sutures.   Next, the interatrial groove was dissected out and the left atrium was  opened.  The mitral retractor was placed with good exposure of the mitral  valve.  Inpection of the leaflets revealed no prolapsing segments.  There  was normal leaflet and  subvalvular anatomy.  There was posterior annular  dilatation.  The anterior leaflet  was roughly sized for a 28 Seguin ring.  The ring was not opened at this point.  2-0 Ethibond horizontal mattress  annular sutures were placed circumferentially around the annulus.  The valve  then was formally sized using both anterior leaflet size and commissural  distance, both sized for a 28 Seguin annuloplasty ring.  The sutures then  were passed through the sewing ring of the valve.  The valve was lowered  into place and sutures sequentially tied.  Retrograde cardioplegia was  administered at 20 minute intervals throughout the valve portion of the  procedure.  After placing the ring, the valve was inspected.  The left  ventricle was distended with iced saline.  The valve maintained good  pressure and was competent with no residual leak.  A red rubber catheter was  left across the valve for deairing purposes.  The left atrium then was closed in two layers.  Rewarming of the patient was initiated.  The left  atrial closure was performed with a running horizontal 4-0 mattress Prolene  suture followed by a simple running 4-0 Prolene suture.  At the completion  of the suture line before tying the sutures, left ventricular deairing was  performed.  The red rubber catheter then was removed and the sutures were  tied.   Next, the vein grafts were cut to length and the cardioplegia cannula was  removed from the ascending aorta.  4.5 mm punch aortotomies were performed.  There was thick, significant plaquing of the ascending aorta anteriorly.  This plaque was 3 to 4 mm thick making the proximal anastomoses difficult.  Care was taken to tack the plaque back into the aortic wall with sutures and  no loose debris or plaque material was seen.  The proximal anastomoses were  performed with running 6-0 Prolene sutures.   At the completion of the final proximal anastomosis, the patient was placed  in Trendelenburg  position.  The bulldog clamp was removed from the left  mammary artery. A warm dose of retrograde cardioplegia was administered to  deair the coronaries.  The left ventricle and aortic root were deaired.  The  balloon was released on the retrograde catheter and the aortic cross-clamp  was removed.  The total cross-clamp time was 152 minutes.  The patient  required a single defibrillation with 20 joules.  He then was  in sinus  rhythm.   All proximal and distal anastomoses were inspected for hemostasis.  While  the patient was being rewarmed, epicardial pacing wires were placed on the  right ventricle and right atrium.  Additional deairing was performed.  The  superior vena cava cannula was repositioned into the right atrium and the  inferior vena cava cannula was re moved.  After the patient had been  rewarmed to a core temperature of 37 degrees Celsius, the dopamine infusion  which had been started prebypass was continued.  A low dose milrinone  infusion was initiated.  The patient was then weaned from cardiopulmonary  bypass without difficulty.  The total bypass time was 215 minutes.  Initial  cardiac index was greater than 2 L per minute per meter squared and the  patient remained hemodynamically stable thereafter.  The patient did have  low urine output both pre, during and post bypass.  Post bypass  transesophageal echocardiography revealed no residual mitral regurgitation.  There was no significant change in left ventricular function.  A test dose  of protamine was administered and was well tolerated.  The atrial and aortic  cannulae were removed.  The remainder of the protamine was administered  without incident.  The chest was irrigated with 1L of warm normal saline  containing 1 gm of vancomycin.  Hemostasis was achieved.  A left pleural and  two mediastinal chest tubes were placed through separate subcostal  incisions.  The pericardium was not reapproximated.  Of note, the  patient did have significant adhesions in the left pleural space and no free pleural  space was ever entered on the left side.  The right pleura was open and a  small effusion was drained.  The sternum was closed with heavy gauge  interrupted stainless steel wires.  It came together easily without tension.  The remainder of the incisions were closed in standard fashion.  All sponge,  needle and instrument counts were correct at the end of the procedure.  The  patient was taken from the operating room to the surgical intensive care  unit intubated in critical but stable condition.      SCH/MEDQ  D:  08/31/2004  T:  08/31/2004  Job:  [id number]

## 2010-10-09 NOTE — H&P (Signed)
NAME:  [name] [name], [name] [name] NO.:  [id number]   MEDICAL RECORD NO.:  [id number]          PATIENT TYPE:  INP   LOCATION:  3709                         FACILITY:  [facility]   PHYSICIAN:  Rollene Rotunda, M.D.   DATE OF BIRTH:  06/02/1940   DATE OF ADMISSION:  08/24/2004  DATE OF DISCHARGE:                                HISTORY & PHYSICAL   PRIMARY CARE PHYSICIAN:  [name], M.D.   REASON FOR ADMISSION:  Patient with cardiomyopathy and shortness of breath.   HISTORY OF PRESENT ILLNESS:  The patient is a pleasant 70 year old African-  American gentleman who I first met in March of last year.  He presented with  chest discomfort and ruled in for non-Q-wave myocardial infarction.  He did  not undergo cardiac catheterization at that time as he had renal  insufficiency.  Instead he had a Cardiolite which suggested only mild  anteroapical ischemia.  He was managed medically following this and did well  until the last few weeks.  He says that he has had progressive dyspnea with  exertion.  He really seems to minimize this, though his wife reports that  she notices this.  He does not describe any resting shortness of breath and  has no PND or orthopnea.  He has had no chest discomfort, neck discomfort,  arm discomfort, activity such as nausea and vomiting, excessive diaphoresis.  He has had no palpitations, presyncope, or syncope.  He seems to think this  has perhaps been slowly progressive.  I saw him in the office at which time  he did appear slightly dyspneic when he was getting dressed.  A BNP drawn  that day was elevated greater than 600.  A stress perfusion study suggested  that he now had an ejection fraction of approximately 29% with diffuse  hypokinesis and scar in the anterior/anterolateral base to midsegment and at  the inferior base.  An echocardiogram suggested EF to be approximately 40%  with moderately severe mitral regurgitation and severe inferobase and  posterior hypokinesis with mild lateral hypokinesis.  He is now admitted for  further evaluation and management prior to cardiac catheterization.   PAST MEDICAL HISTORY:  1.  Chronic renal insufficiency (last creatinine was 2.8 per Dr. Dagoberto Ligas in      March).  2.  Diabetes mellitus.  3.  Hypertension.  4.  Gout.  5.  Prostate cancer.  6.  Non-Q-wave myocardial infarction as described.   PAST SURGICAL HISTORY:  Radiation seed implants of prostate.   ALLERGIES:  None.   MEDICATIONS:  1.  Aspirin 325 mg daily.  2.  Isosorbide 60 mg daily.  3.  Plavix 75 mg daily.  4.  Actos 45 mg daily.  5.  Glipizide 5 mg b.i.d.  6.  Lipitor 80 mg daily.  7.  Allopurinol.  8.  Norvasc 10 mg daily.  9.  Metoprolol 100 mg b.i.d.   SOCIAL HISTORY:  The patient is married.  He is a retired Emergency planning/management officer.  He quit smoking in 1987.  He does not drink alcohol.   FAMILY HISTORY:  Noncontributory for early coronary artery disease.   REVIEW OF SYSTEMS:  As stated in the HPI.  Negative for all other systems.   PHYSICAL EXAMINATION:  GENERAL:  The patient is in no distress.  VITAL SIGNS:  Blood pressure 130/89, heart rate 70 and regular.  HEENT:  Eyes unremarkable.  Pupils equal, round, and react to light.  Fundi  not visualized.  Oral mucosa unremarkable.  NECK:  No jugular venous distention, wave form within normal limits, carotid  upstrokes brisk and symmetric, no bruits, no thyromegaly.  LYMPHATICS:  No cervical, axillary, or inguinal adenopathy.  LUNGS:  Clear to auscultation bilaterally.  BACK:  No costovertebral angle tenderness.  CHEST:  Normal auricle.  HEART:  PMI not displaced or sustained, S1 and S2 within normal limits.  No  S3, no S4.  Very soft axillary systolic murmur, nonradiating, actually not  holosystolic.  No diastolic murmurs.  ABDOMEN:  Flat, positive bowel sounds.  Normal in frequency and pitch.  No  bruits, rebound, guarding.  No midline pulsatile masses, hepatomegaly,   splenomegaly.  SKIN:  No rashes, no nodules.  EXTREMITIES:  2+ pulses throughout, no edema, no cyanosis, no clubbing,  trace bilateral lower extremity edema.  NEUROLOGIC:  Oriented to person, place, and time.  Cranial nerves II-XII  grossly intact.  Motor grossly intact.   LABORATORY DATA:  EKG pending.  Labs pending.  Chest x-ray pending.   ASSESSMENT/PLAN:  1.  Cardiomyopathy.  The patient has a new cardiomyopathy with regional wall      motion abnormalities.  He has almost definitely had an out of hospital      myocardial infarction since I last saw him.  He has mitral regurgitation      most likely related to this.  I suspect that he has three vessel      coronary disease.  The time has come for cardiac catheterization and the      patient presents for this.  He understands that there will be a      significant risk for renal failure.  I will go ahead and get a chest x-      ray and labs.  I will hydrate him gently, watching eyes and nose and      weights very carefully.  Will need to watch him closely for signs of      volume overload, as I think he is relatively tenuous.  He will continue      the medications as listed.  I have contacted Dr. Kathrene Bongo to follow      along and offer suggestions.  2.  Diabetes mellitus.  The patient will continue the medications as listed      and will cover him with sliding scale.  I      have contacted Dr. Dagoberto Ligas.  3.  Risk reduction.  He will continue on his Lipitor and will check a      fasting lipid profile.  4.  Gout.  He will continue his allopurinol.      JH/MEDQ  D:  08/24/2004  T:  08/24/2004  Job:  [id number]   cc:   [name], M.D.  [address], [address]  [place]  [place] [address]  Fax: [phone]   Cecille Aver, M.D.  [address]  [place]  [place] [address]  Fax: [phone]

## 2010-10-09 NOTE — Consult Note (Signed)
NAME:  [name] [name], TORRANCE NO.:  [id number]   MEDICAL RECORD NO.:  [id number]          PATIENT TYPE:  INP   LOCATION:  4711                         FACILITY:  [facility]   PHYSICIAN:  Vida Roller, M.D.   DATE OF BIRTH:  [birth year]   DATE OF CONSULTATION:  10/30/2004  DATE OF DISCHARGE:  10/30/2004                                   CONSULTATION   ALLERGIES:  No known drug allergies.   HISTORY OF PRESENT ILLNESS:  [name] [name] is a 70 year old male who has a  history of severe 3 vessel coronary artery disease. He is status post  coronary artery bypass graft surgery with mitral valve annuloplasty. This  was done on August 31, 2004. He was admitted on October 27, 2004 with progressive  dyspnea and fatigue, lasting for 7 days prior to this admission. His BNP on  admission was greater than 3,200. His cardiac enzymes were negative for  acute myocardial infarction. The patient has diabetes, chronic renal  insufficiency with a baseline creatinine of 2.8, hypertension, and  dyslipidemia. He has been diuresing at the time of this consult on Lasix 40  mg x12 hours and has lost altogether 7 pounds during a 72 hour period. His  electrocardiogram is non-diagnostic for acute myocardial infarction. The  admission diagnosis is exacerbation of congestive heart failure.  Echocardiogram was done October 28, 2004. Ejection fraction 10% to 20%. The  patient has a past history of prior myocardial infarction in March of 2005.  At that time, only a Cardiolite study was done. Catheterization was withheld  secondary to fear of precipitating hemodialysis dependent respiratory  failure. The Cardiolite showed mild anterior apical ischemia. He was on  medical management until April of 2006 and did well until he presented with  progressive dyspnea. As far as congestive failure symptoms, when compensated  the patient can walk 35 to 45 minutes straight without dyspnea. Cardiac risk  factors are including  hypertension, diabetes, and dyslipidemia. Known risk  equivalents are known coronary artery disease, history of prior non-Q wave  myocardial infarction in March of 2005 and diabetes. He had coronary artery  bypass graft surgery August 31, 2004 the left internal mammary artery to the  left anterior descending, saphenous vein graft to the first obtuse marginal,  saphenous vein graft to the posterior descending coronary artery, and the  saphenous vein graft to the diagonal. Cardiac study in March 2006, ejection  fraction of 29% with diffuse hypokinesis. Echocardiogram October 28, 2004  ejection fraction 10% to 20% with severe diffuse left ventricular  hypokinesis.   MEDICATIONS:  Include Avapro 150 mg twice daily, Allopurinol 100 mg daily,  Glucotrol 5 mg twice daily, Actos 45 mg daily, Lasix 40 mg p.o. b.i.d.,  potassium chloride 20 meq twice daily, Hectorol 0.5 mg daily, Coumadin at  home 5 mg on Monday, Wednesday, Friday and Sunday and 2.5 mg on Tuesday,  Thursday, and Saturday. Lipitor 80 mg daily at bedtime, Toprol XL 150 mg  daily.   SOCIAL HISTORY:  The patient lives in [place] with his wife. He is  retired but currently  drives tour buses. No tobacco. No ethanol. No  recreational drugs.   FAMILY HISTORY:  Mother died of a myocardial infarction at age 44. Father  died at age 90 of lung cancer. He has 1 sister doing well but she has  hypertension.   REVIEW OF SYSTEMS:  No fevers, chills, night sweats, weight change, or  adenopathy. HEENT:  No epistaxis, hoarseness, vertigo, photophobia, or  hearing loss. INTEGUMENT:  No rashes, non-healing ulcerations.  CARDIOPULMONARY:  No chest pain. He does have orthopnea with 2 to 3 pillows.  He does have lower extremity edema. Normally, when compensated, not short of  breath when walking. However, he was short of breath for 7 days prior to  this admission. No history of pre-syncope or syncope. No claudication or  history of palpitations.  Nocturia 1 to 2 times. NEURO/PSYCHIATRIC:  The  patient has no numbness, depression, anxiety. His main presenting ongoing  circumstance is fatigue after a full day of activity. GASTROINTESTINAL:  No  history of gastrointestinal bleeding. No bright red blood per rectum. No  melena. No gastroesophageal reflux disease. ENDOCRINE:  Diabetes as  described. On Actos and Glucotrol. MUSCULOSKELETAL:  Arthralgias, bilateral  knee arthroscopies. All other systems are negative.   PHYSICAL EXAMINATION:  VITAL SIGNS:  Temperature 96.8, pulse 90, blood  pressure 123/78, respiratory rate 20. O2 saturation 100% on room air.  Admission weight was 168. The weight on October 29, 2004 was 153 minus 7 pound  diuresis in 72 hours.  HEENT:  Normocephalic and atraumatic. Eyes:  Pupils are equal, round, and  reactive to light. Extraocular movements intact. Sclerae anicteric. Nares  without discharge.  NECK:  Supple. No carotid bruits auscultated. No thyromegaly. No jugular  venous distention. No cervical lymphadenopathy.  HEART:  Regular rate and rhythm with an S3. No murmur.  LUNGS:  Clear to auscultation and percussion bilaterally.  ABDOMEN:  Soft, nondistended. Bowel sounds are present. No  hepatosplenomegaly. Abdominal aorta non-pulsatile. There is a well healed  median sternotomy incision.  EXTREMITIES:  No evidence of clubbing, cyanosis. Dorsalis pedis on the right  is 4 over 4. Dorsalis pedis on the left is 3 over 4.  MUSCULOSKELETAL:  No joint deformity, effusions, kyphosis, scoliosis.  NEUROLOGIC:  No focal deficits noted.   LABORATORY DATA:  A chest x-ray on October 27, 2004:  Cardiomegaly and mild  vascular congestion. Small bilateral pleural effusions.   Electrocardiogram on October 27, 2004: Sinus rhythm. Occasional premature  ventricular contractions. QRS is 80 milliseconds.   PT is 24.2, INR 3.1. Serum electrolytes on October 29, 2004:  Sodium 141, potassium 3.5, chloride 107, carbonate 27, BUN 27,  creatinine 3.4, glucose  153. TSH is 1.943. D-dimer 1.02. Cardiac enzymes were 0.02, then 0.01, then  0.02. Liver profile:  Cholesterol 147, triglycerides 115, HDL cholesterol  36, LDL cholesterol 88.   IMPRESSION:  1.  Admission with exacerbation of congestive heart failure. BNP on      admission was greater than 3,200. He was ruled out for myocardial      infarction this admission.  2.  Effective diuresis over a 72 hour period with a weight loss of 7 pounds      and re-gaining respiratory status of 100% oxygen saturation on room air.  3.  Echocardiogram October 28, 2004. Ejection fraction of 10% to 20%. Ischemic      cardiomyopathy. Mild mitral regurgitation.  4.  Severe 3 vessel coronary artery disease, status post coronary artery  bypass graft surgery with mitral valve annuloplasty August 31, 2004.  5.  Post coronary artery bypass graft surgery atrial fibrillation, now in      sinus rhythm.  6.  History of non-Q wave myocardial infarction March of 2005.  7.  Electrocardiogram  showing narrow QRS of 85 milliseconds.  8.  Hypertension.  9.  Dyslipidemia.  10. Chronic renal insufficiency.  11. Gout.  12. When compensated, the patient walks 35 minutes. He has fatigue by the      days end when is very active.  13. Coumadin a little high. PTO is 30.1.  14. History of prostate cancer with seed implants.  15. Bilateral knee arthroscopy.  16. Anemia.   PLAN:  The patient clearly qualified for ICD based on MADIT II criteria. The  patient and wife do ask the question will this device improve my symptoms?  The patient would need a positive tissue Doppler study to quality for bi-V.  The patient, on the surface, looks as if he has a narrow QRS and would not  quality for left ventricular lead placement. There is a thought that maybe  Actos, which is pro-edema-forming, if held, could improve his congestive  heart failure symptoms. Dr. Graciela Husbands will be in contact with his  endocrinologist, Dr.  Dagoberto Ligas about this.       GM/MEDQ  D:  10/30/2004  T:  10/30/2004  Job:  [id number]   cc:   Rollene Rotunda, M.D.   Duke Salvia, M.D.   [name], M.D.  [address]  [place]  [place] [address]  Fax: [phone]   Cecille Aver, M.D.  [address]  [place]  [place] [address]  Fax: [phone]

## 2010-10-09 NOTE — Assessment & Plan Note (Signed)
[facility]                               PULMONARY OFFICE NOTE   NAME:SIMSOmir, Cooprider                       MRN:          [id number]  DATE:02/01/2006                            DOB:          10/01/1940    HISTORY OF PRESENT ILLNESS:  The patient is a 70 year old male who I have  been asked to see for obstructive sleep apnea.  The patient has undergone  nocturnal polysonography, where during a split night he was found to have  179 obstructive events and central events in the first 138 minutes of sleep.  He was then placed on CPAP, where he continued to have obstructive and  central apneas.  The CPAP titration was very difficult because the patient  awakened and had a hard time getting back to sleep once the pressure was  initiated.  The patient states that he typically goes to bed  between 9 and  11 and gets up between 7:30 and 8 to start his day.  He has been noted to  have loud snoring and pauses in his breathing during sleep but denies  choking arousals.  The patient thinks that he is refreshed whenever he  arises.  He adamantly denies inappropriate daytime sleepiness, although his  girlfriend notes that he does this all of the time whenever he sits down.  He denies any problems watching TV or also driving.  Of note the patient's  weight has been stable over the last two years.   PAST MEDICAL HISTORY:  1. Significant for hypertension.  2. History of coronary artery disease with myocardial infarction, CABG, as      well as valve replacement.  3. History of congestive heart failure with an ischemic cardiomyopathy and      EF of 10-20%.  4. History of diabetes.  5. History of prostate cancer, for which he had radioactive seed      implantation.  6. History of renal insufficiency.   CURRENT MEDICATIONS:  1. Glucotrol 5 mg b.i.d.  2. Lasix 40 mg b.i.d.  3. KCl 20 mEq daily.  4. Lipitor 80 mg daily.  5. Allopurinol 100 mg every other day.  6.  Felodipine 10 mg daily.  7. Bidil 20 mg t.i.d.  8. Metoprolol 100 mg daily.   ALLERGIES:  The patient has no known drug allergies.   SOCIAL HISTORY:  He is married and has children.  He has a history of  smoking one half pack per day for 30 years.  He has not smoked since 1987.   FAMILY HISTORY:  Remarkable for his mother having myocardial infarction,  otherwise unremarkable.   REVIEW OF SYSTEMS:  As per history of present illness.  Also see patient  intake form documented on the chart.   PHYSICAL EXAMINATION:  GENERAL:  He is a well-developed male in no acute  distress.  VITAL SIGNS:  Blood pressure 116/62, pulse 61, temperature is 97.7.  Weight  is 166 pounds.  O2 saturation on room air is 97%.  HEENT:  Pupils equal, round, and reactive to light and accommodation.  Extraocular  movements are intact.  Nares show deviated septum to the left.  Oropharynx shows elongation of soft palate and uvulo-salval narrowing.  NECK:  Supple without JVD or lymphadenopathy.  No palpable thyromegaly.  CHEST:  Totally clear.  CARDIAC:  Reveals regular rate and rhythm.  No murmurs, rubs, or gallops.  ABDOMEN:  Soft, nontender, with good bowel sounds.  GENITAL/RECTAL/BREASTS:  Exam not done, not indicated.  EXTREMITIES:  Lower extremities show trace edema.  Pulses are intact  distally.  NEUROLOGIC:  He is alert and oriented with no obvious observable motor  defects.   IMPRESSION:  Severe, obstructive, and central sleep apnea documented by  nocturnal polysonography.  The patient is denying a lot of the symptoms  associated with this disorder, but I think he is much more symptomatic than  he realizes.  His girlfriend states that he will doze constantly whenever he  sits down and gets quiet.  The patient's pressure titration will be very  difficult because he has mixed obstructive and central apneas, and I think  we should go ahead and start him on CPAP and then have him return to the  sleep lab for  formal titration.  His central apneas are probably secondary  to his cardiomyopathy.   PLAN:  1. Initiate CPAP starting at 10 cm.  2. The patient will follow up in four weeks or sooner if there are      problems.                                   Barbaraann Share, MD,FCCP   [facility]/MedQ  DD:  02/15/2006  DT:  02/16/2006  Job #:  [id number]   cc:   Rollene Rotunda, MD, [facility]

## 2010-10-09 NOTE — Discharge Summary (Signed)
NAME:  [name] [name], [name] [name] NO.:  [id number]   MEDICAL RECORD NO.:  [id number]                   PATIENT TYPE:  INP   LOCATION:  0353                                 FACILITY:  [facility]   PHYSICIAN:  Rollene Rotunda, M.D.                DATE OF BIRTH:  03/08/1941   DATE OF ADMISSION:  08/16/2003  DATE OF DISCHARGE:  08/20/2003                                 DISCHARGE SUMMARY   BRIEF HISTORY:  This is a pleasant 70 year old male with no previous cardiac  history who was admitted to [facility] on August 16, 2003 for  further evaluation of chest pain with plans for cardiac catheterization. The  patient does have a history of renal insufficiency. A renal consult was  obtained from Dr. Arlean Hopping, and the patient was hydrated and prepared for  possible catheterization.   Dr. Andee Lineman saw the patient and was concerned that the catheterization might  lead to renal failure. He recommended a more conservative approach with an  adenosine Cardiolite and an echocardiogram with possible medical therapy  based on those results.      Delton See, P.A. [facility]                  Rollene Rotunda, M.D.    DR/MEDQ  D:  08/20/2003  T:  08/20/2003  Job:  [id number]   cc:   [name], M.D.  [address]  [place]  [place] [address]  Fax: [phone]   Maree Krabbe, M.D.  Fax: [phone]

## 2010-10-09 NOTE — Op Note (Signed)
NAME:  [name] [name], [name] [name] NO.:  [id number]   MEDICAL RECORD NO.:  [id number]          PATIENT TYPE:  INP   LOCATION:  2314                         FACILITY:  [facility]   PHYSICIAN:  Zenon Mayo, MDDATE OF BIRTH:  1940/06/12   DATE OF PROCEDURE:  08/31/2004  DATE OF DISCHARGE:                                 OPERATIVE REPORT   PROCEDURE:  Intraoperative transesophageal echocardiogram.   INDICATIONS FOR PROCEDURE:  Evaluation of mitral valve.   DESCRIPTION OF PROCEDURE:  Mr. Tetterton is a 70 year old gentleman with a  history of hypertension, coronary artery disease and diabetes who recently  was found by cath to also have significant mitral regurgitation.  Patient  was brought to the operating room by Dr. Dorris Fetch today for coronary  artery bypass surgery  as well as repair versus replacement of the mitral  valve.  Intraoperative transesophageal echocardiogram was requested to  evaluate not only the left ventricle and its function but also the mitral  valve.  Patient was brought to the operating room and placed under general  anesthesia.  After confirmation of endotracheal tube placement, a  transesophageal echo probe was placed into the patient's esophagus without  any complications.  The left ventricle was imaged initially.  It revealed  normal wall thickness.  Global hypokinesis was noted with an ejection  fraction estimated to be 30 to 35%.  the mitral valve was then imaged.  The  leaflets appeared to move freely.  There was no vegetation or calcifications  noted.  Upon further evaluation with color, a moderate central get of  regurgitation was seen reaching the posterior wall of the left atrium.  When  evaluating flow in the pulmonary veins, there was systolic suppression of  flow, however, no reversal of flow was noted.  The aortic valve was  trileaflet in nature.  The valve leaflets were highly mobile, no  calcifications or vegetations noted. The aortic  valve area was 2.98 sq cm.  There was no aortic insufficiency seen.  Both pulmonic and tricuspid valves  appeared normal in structure and both had trace regurgitation across the  valves.  The intra-atrial septum was intact.  The thoracic aorta revealed  severe atherosclerotic disease extending proximally to the aortic arch.  Prior to crossclamp and cannulation of the aorta, an epiaortic scan was done  by Dr. Dorris Fetch.  Upon visualizing the epiaortic scan, the area to be  cannulated and crossclamped was free of disease.   At the conclusion of bypass, the heart was again imaged.  A prosthetic ring  had been placed in the mitral position and appeared to be seated well.  The  valve leaflets continued to move freely and no mitral regurgitation was seen  at this time.  The aortic cannula was removed without any evidence of  dissection.  All other structures and functions of the heart remained as  they had prior to bypass.  The patient was started on inotropic support  including dopamine and milrinone and remained stable during the post bypass  course.  At the conclusion of the procedure, the transesophageal echo  probe  was removed from the patient's esophagus without complication.  The patient  was taken to the intensive care unit directly from the operating room in a  stable condition.      WEF/MEDQ  D:  08/31/2004  T:  08/31/2004  Job:  [id number]

## 2010-10-09 NOTE — Discharge Summary (Signed)
NAME:  [name] [name], LEMAY NO.:  [id number]   MEDICAL RECORD NO.:  [id number]          PATIENT TYPE:  INP   LOCATION:  2038                         FACILITY:  [facility]   PHYSICIAN:  Salvatore Decent. Dorris Fetch, M.D.DATE OF BIRTH:  [birth year]   DATE OF ADMISSION:  08/24/2004  DATE OF DISCHARGE:                                 DISCHARGE SUMMARY   ADMISSION DIAGNOSIS:  Shortness of breath.   PAST MEDICAL HISTORY AND DISCHARGE DIAGNOSES:  1.  Chronic renal insufficiency with a baseline creatinine of 2.8 per Dr.      Dagoberto Ligas.  2.  Type 2 diabetes mellitus.  3.  Hypertension.  4.  Gout.  5.  Prostate cancer status post seed implant.  6.  Anemia.  7.  Coronary artery disease status post non-Q wave anterior myocardial      infarction in 2005 status post coronary artery bypass grafting x4.  8.  Severe left ventricular dysfunction.  9.  Severe mitral regurgitation status post mitral valve repair with 28 mm      Seguin annuloplasty ring.  10. Postoperative atrial fibrillation, resolved.  11. Congestive heart failure.   ALLERGIES:  NO KNOWN DRUG ALLERGIES.   BRIEF HISTORY:  The patient is a 70 year old African-American male with  chronic renal insufficiency and diabetes mellitus.  He had a myocardial  infarction in 2005 which was of limited severity and ejection fraction at  that time was 55-60%.  The patient was treated medically and refused cath at  the time secondary to risks of need for hemodialysis in light of his chronic  renal insufficiency.  The patient did well until approximately two weeks  prior to admission when he began to develop dyspnea on exertion.  This  became progressive to where minimal exertion was required before he became  dyspneic.  The patient denied resting shortness of breath or paroxysmal  nocturnal dyspnea.  He was evaluated in the office by Dr. Antoine Poche who found  that the patient had an elevated BNP level.  A Cardiolite was performed  which revealed  an ejection fraction of 29% with diffuse hypokinesis.  An  echocardiogram estimated an ejection fraction of 40% with moderately severe  mitral regurgitation.  The patient was then advised to be admitted to [facility] for elective cardiac catheterization.   HOSPITAL COURSE:  The patient was admitted on August 24, 2004 for hydration  and Mucomyst treatment in light of his chronic renal insufficiency and in  preparation for cardiac catheterization.  Cardiac catheterization was  performed on August 26, 2004 and revealed severe three-vessel coronary artery  disease as well as severe mitral regurgitation.  Secondary to this  information, Dr. Dorris Fetch of the CVTS service was consulted regarding  surgical revascularization and mitral valve repair.  Dr. Dorris Fetch  evaluated the patient on August 26, 2004 and it was his opinion that the  patient should proceed with coronary artery bypass graft surgery and mitral  valve repair.  The patient was in congestive heart failure and was being  managed medically.   The renal service was consulted after the patients  admission and they have  followed him throughout the hospital course.  The patient chronic renal  insufficiency has been monitored closely and has been treated with hydration  and then diuresis accordingly as needed.   The patient was taken to the operating room on August 31, 2004 for coronary  artery bypass grafting x4.  The left internal mammary artery was grafted to  the LAD, saphenous vein was grafted to the OM1, saphenous vein was grafted  to the PD, and saphenous vein was grafted to the diagonal.  Endoscopic  vessel harvesting was performed on the right thigh and open below the knee  of the right lower extremity.  Mitral valve repair was performed using a #28  Seguin annuloplasty ring.  The patient tolerated the procedure well and was  hemodynamically stable immediately postoperatively.  The patient was  transferred from the OR to  the SICU in stable condition.  The patient was  extubated without complication and woke from anesthesia neurologically  intact.  The patient was anemic and was transfused with a unit of packed  rbcs postoperatively.   On postoperative day one the patients invasive lines and chest tubes were  discontinued in a routine manner.  His drips were also discontinued without  complication.  He began cardiac rehab on postoperative day one and has  tolerated this well postoperatively.  The patient has been volume overloaded  and has been diuresed accordingly with regard to his chronic renal  insufficiency.  He was started on Coumadin on postoperative day one  secondary to mitral valve repair.   On postoperative day three the patient was noted to go into atrial  fibrillation with a heart rate up to the 150s.  He was started on a Cardizem  drip and continued on a beta-blocker.  He subsequently converted to a normal  sinus rhythm and has maintained a sinus rhythm throughout the remainder of  the postoperative course.  The IV Cardizem was subsequently discontinued and  his beta-blocker has been increased.  The cardiology service noticed on  postoperative day four that a baseline echocardiogram would be checked after  discharge.  If left ventricular function was less than or equal to 30 at  three months postoperatively, an EP evaluation would require for possible  ICD placement.   The remainder of the patients postoperative course has progressed as  expected.  On postoperative day five the patient is without complaint, he is  afebrile with stable vital signs and maintaining normal sinus rhythm.  He is  still volume overloaded and is being gently diuresed.  His diabetes mellitus  has been controlled postoperatively with sliding-scale insulin as well as  his p.o. medications of glipizide and Actos.   PHYSICAL EXAMINATION:  CARDIAC:  Regular rate and rhythm.  LUNGS:  Clear to auscultation  bilaterally. ABDOMEN:  Benign.  The incisions are clean, dry and intact and there is no  edema present in the bilateral lower extremities.   His creatinine is trending down and is currently 3.2.  His baseline is 2.8.  The patients INR is 1.5, which is subtherapeutic, however he will be  continued on his Coumadin until such time it is greater than or equal to 2.  It should be maintained between 2 and 2.5.   The patient is in stable condition at this time and as long as he continues  to progress in the current manner, will be ready for discharge within the  next 1-2 days, pending morning round reevaluation.  LABS:  INR on September 05, 2004 1.5.  BNP on September 05, 2004 __________ sodium  140, potassium 3.4, BUN 29, creatinine 3.2, glucose 120.  CBC on September 04, 2004 -- white count 11.7, hemoglobin 8.6, hematocrit 25, platelets 102.   CONDITION ON DISCHARGE:  Improved.   INSTRUCTIONS:   MEDICATIONS:  1.  Metoprolol 50 mg b.i.d.  2.  Diovan 80 mg daily.  3.  Lipitor 80 mg daily.  4.  Coumadin 5 mg daily then as directed by Dr. Antoine Poche.  5.  Allopurinol 100 mg daily.  6.  Glipizide 5 mg b.i.d.  7.  Actos 45 mg daily.  8.  Lasix 40 mg daily.  9.  K-Dur 20 mEq daily.  10. Tylox 1-2 p.o. q.4-6h. p.r.n. pain.   ACTIVITY:  No driving, no lifting more than 10 pounds and the patient should  continue daily breathing and walking exercises.   DIET:  Low salt, low fat and carbohydrate modified for medium calorie diet.   WOUND CARE:  The patient may shower daily and clean his incisions with soap  and water.  If wound problems arise, he should contact the CVTS office at  [phone].   FOLLOWUP APPOINTMENT:  1.  PT and INR blood work should be drawn on September 08, 2004 by [facility]      [facility].  The patient will be instructed to contact their office      for an appointment.  2.  Dr. Antoine Poche two weeks after discharge.  The patient should call [phone] to arrange this appointment.   A chest x-ray will be taken at that      time, which he will be instructed to bring with him to the followup      appointment with Dr. Dorris Fetch.  3.  Dr. Dorris Fetch three weeks after discharge.  CVTS office will contact      the patient with a date and time of the followup appointment.  4.  Dr. Carolynn Comment.  The patient should call [phone] to arrange a      followup appointment 4-8 weeks after discharge.      AY/MEDQ  D:  09/05/2004  T:  09/05/2004  Job:  [id number]   cc:   Cecille Aver, M.D.  [address]  [place]  [place] [address]  Fax: [phone]   Salvatore Decent. Dorris Fetch, M.D.  [address]  [place]  [place] [address]   Rollene Rotunda, M.D.

## 2010-10-09 NOTE — Op Note (Signed)
[facility]  Patient:    [name] [name], [name] [name] [name] [name]                       MRN: [id number] Proc. Date: 08/11/00 Adm. Date:  [id number] Attending:  Londell Moh CC:         [name], M.D.   Operative Report  PREOPERATIVE DIAGNOSIS:  Carcinoma of the prostate.  POSTOPERATIVE DIAGNOSIS:  Carcinoma of the prostate.  OPERATION PERFORMED:  Radiation seed implantation.  SURGEON:  Jamison Neighbor, M.D.  RADIOLOGIST:  Jackelyn Knife, M.D.  ANESTHESIA:  General.  COMPLICATIONS:  None.  DRAINS:  None.  INDICATIONS FOR PROCEDURE:  This 70 year old male had an elevated PSA of 13.4. 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  The patient was given options for therapy and he elected to undergo radiation therapy as opposed to surgery.  He feels that he would prefer radiation seed implantation to external beam radiation therapy.  He has been staged with a CT scan and a bone scan and has undergone appropriate preoperative evaluation as well as preimplant planning.  The patient understands the risks and benefits of the procedure including the possibility of rectal injury, bladder injury, impotence, incontinence as well as the possible risk for recurrent tumor.  He also realizes that because his prostate is slightly enlarged that he may have problems with retention.  He gave full and informed consent.  After successful induction of general anesthesia, the patient was placed in the dorsal lithotomy position, prepped with Betadine and prepped and draped in the usual sterile fashion.  The patient was positioned carefully so that the images obtained with transrectal ultrasound were identical to those seen in the preplanning session.  Stabilizing needles were then inserted.  The patient underwent an implant of I-125.  A total of 111 seeds were planned, given through a total of 24 needles.  On the patients left hand side, an additional implant of  three seeds was placed in order to fill in one area that did not appear to be completely covered.  The patient underwent cystoscopy at the end of the procedure.  No seeds were seen anywhere in the bladder.  The patient had a Foley catheter inserted.  He will be sent home with Pyridium for any discomfort.  He will be started on Flomax and he will also have pain medication and antibiotics.  The patient will return to see [place] in follow-up tomorrow for removal of the catheter and will then have his PSA followed carefully. DESCRIPTION OF PROCEDURE: DD:  08/11/00 TD:  08/11/00 Job: [id number]

## 2010-10-09 NOTE — H&P (Signed)
NAME:  [name], [name] NO.:  [id number]   MEDICAL RECORD NO.:  [id number]          PATIENT TYPE:  INP   LOCATION:  1827                         FACILITY:  [facility]   PHYSICIAN:  Vida Roller, M.D.   DATE OF BIRTH:  1940-11-06   DATE OF ADMISSION:  10/27/2004  DATE OF DISCHARGE:                                HISTORY & PHYSICAL   PRIMARY:  Dr. Corrin Parker.   CARDIOLOGIST:  Dr. Angelina Sheriff.   NEPHROLOGIST  Dr. Cecille Aver.   HISTORY OF PRESENT ILLNESS:  [name] [name] is a 70 year old man who is recently  status post bypass surgery in April 2006.  He presented to a [facility] today after a week of progressive shortness of breath associated  with some fatigue.  He denies any chest discomfort.  He denies any  significant lower extremity edema.  No PND or orthopnea.  His wife, however,  reports that he has been quite lethargic and difficult to get active over  the last week, also that his blood pressure is been mildly labile with  maximum systolics in the 160 range.  He did well postoperatively, until he  thinks about a week ago when he noticed that he was having more shortness of  breath.  He comes to the ER now and is found to be in pulmonary edema with  B-type natriuretic peptide greater than 3200.  His past medical history  significant for congestive heart failure.  He was evaluated with a  Cardiolite in our office showing multivessel coronary disease with depressed  LV systolic function.  He underwent a heart catheterization in April of  2006; he had a 50% left main, 90% LAD, 100% circumflex and 95% RCA with an  EF of 29%, and he was referred for bypass surgery.  He underwent bypass  surgery on August 31, 2004 with a LIMA to his LAD, saphenous vein graft to a  diagonal branch, saphenous vein graft to an obtuse marginal and saphenous  vein graft to the posterior descending.  Also at that time, it was noted he  had severe mitral regurgitation  and this was fixed with a Sequin ring, and  he has done well since then, with the exception of this particular problem.  He has not had any outpatient cardiology followup since surgery.  He has  chronic renal insufficiency with a baseline creatinine of 2.8.  He has gout,  prostate cancer, status post prostate implants.  He had postoperative atrial  fibrillation and he has diabetes and hypertension.   MEDICATIONS PRIOR TO ADMISSION:  1.  Metoprolol 50  mg twice a day.  2.  Diovan 80 mg once a day.  3.  Lipitor 80 mg once a day.  4.  Coumadin as directed by our Coumadin clinic.  5.  Allopurinol 100 mg once a day.  6.  Glipizide 5 mg twice a day.  7.  Actos 45 mg once a day.  8.  Lasix 20 mg b.i.d.  9.  K-Dur 20 mEq once a day.  10. Hectorol 5 mg daily.  11.  He takes Tylox on a p.r.n. basis.   ALLERGIES:  He is not allergic to any medications.   SOCIAL HISTORY:  He lives in [place] with his wife.  He does not smoke,  drink or use illicit drugs.   REVIEW OF SYSTEMS:  His review of systems is generally negative.   PHYSICAL EXAMINATION:  GENERAL:  He is a well-developed, well-nourished  African-American male in no apparent distress, who is alert and oriented x4.  VITAL SIGNS:  His heart rate is 82.  His blood pressure is 159/106.  HEENT:  Examination of the head, ears, eyes, nose and throat is  unremarkable.  NECK:  Supple.  There is minimal jugular venous distension at 30 degrees.  His carotids have normal upstrokes.  There are no obvious bruits.  CHEST:  His chest has rales about a half of the way up bilaterally with no  dullness to percussion.  CARDIAC:  Regular.  He has no obvious murmur.  His median sternotomy scar is  well-healed; there is no drainage.  ABDOMEN:  His abdomen is soft and nontender with normoactive bowel sounds.  EXTREMITIES:  His lower extremities are without edema.  His pulses are 1+.  His saphenous vein harvest site appears to be well-healed.    LABORATORIES:  His hemoglobin is 13, hematocrit is 40.  His sodium is 143,  potassium 3.8, chloride 112, bicarb 21, BUN 28, creatinine 3.2, blood sugar  147.  Point-of-care enzymes x1 are negative, but his B-type natriuretic  peptide is 3200   Electrocardiogram shows sinus rhythm at a rate of 82 with an old anterior  wall myocardial infarction with inverted T waves in anterior leads all the  way from V1 through V6.  He has flattening in his T waves in the inferior  leads as well, essentially unchanged from his postop electrocardiogram.   His chest x-ray shows pulmonary edema with a small left pleural effusion.   PLAN:  My plan is to admit him and cycle his enzymes.  We will give him  aggressive IV diuresis.  We will improve his blood pressure control by  increasing his Diovan slightly.  He will need an echo to assess his LV  systolic function and depending on the results of his enzymes, consideration  could be made for a heart catheterization, although I suspect that this is  probably not an issue of ischemia but once fluid overload and inadequate  blood pressure control.       JH/MEDQ  D:  10/27/2004  T:  10/27/2004  Job:  [id number]

## 2010-10-09 NOTE — H&P (Signed)
NAME:  [name] [name], [name] [name] NO.:  [id number]   MEDICAL RECORD NO.:  [id number]                   PATIENT TYPE:  EMS   LOCATION:  ED                                   FACILITY:  [facility]   PHYSICIAN:  Rollene Rotunda, M.D.                DATE OF BIRTH:  [birth year]   DATE OF ADMISSION:  08/16/2003  DATE OF DISCHARGE:                                HISTORY & PHYSICAL   PRIMARY CARE PHYSICIAN:  Dr. Corrin Parker.   REASON FOR PRESENTATION:  Patient with chest pain.   HISTORY OF PRESENT ILLNESS:  The patient is a very pleasant 70 year old  African-American gentleman with no prior cardiac history.  He does have  longstanding diabetes and hypertension.  He has renal insufficiency  apparently from these diseases.   He has been doing relatively well.  He did notice last weekend when he  worked in the yard, he was more fatigued than he thought he should have  been, though he has not been doing much all winter.  He was able to play 18  holes of golf 2 weeks ago without any problems.  This morning, while eating  breakfast, he developed substernal chest discomfort.  It was 6/10 in  intensity.  It was across his upper chest.  There was no radiation to his  jaw.  He had some tingling in his hand, but he says he has this at other  times.  He was not diaphoretic, nauseated, or short of breath.  He went to  lie down.  The pain eased slightly and slowly.  When his wife asked him if  he wanted to see a physician, he consented to come into the emergency room.  There, he was noted to have some subtle T-wave inversions in I, aVL, V5, and  V6.  His pain, which had lessened, was alleviated after sublingual  nitroglycerin x 1.  He was relatively hypertensive when he presented which  also resolved.  Currently, he is pain-free.  He thought he was having  heartburn.  However, he does not typically get this.  He has never had this  type of discomfort before.  He denies any shortness of  breath, PND, or  orthopnea.  He has had no palpitations, presyncope, or syncope.   PAST MEDICAL HISTORY:  1. Gout.  2. Prostate cancer.  3. Chronic renal insufficiency.  4. Hypertension for the last 30 years.  5. Diabetes mellitus x 20 years.   PAST SURGICAL HISTORY:  Radiation seem implants for prostate cancer.   ALLERGIES:  None.   MEDICATIONS:  1. Allopurinol 100 mg daily.  2. Actos 45 mg daily.  3. Lasix 40 mg daily.  4. Diltiazem 240 mg daily.  5. Diovan 320 mg daily.  6. Propranolol 80 mg daily.  7. Glipizide 10 mg daily.  8. Lipitor 80 mg daily.   SOCIAL HISTORY:  The patient quit  smoking in 1987.  He has been married 39  years.  He has children and two grandchildren.  He is a retired Physicist, medical.   FAMILY HISTORY:  Noncontributory for early coronary artery disease.   REVIEW OF SYMPTOMS:  As stated in the HPI and negative for all other  systems.   PHYSICAL EXAMINATION:  GENERAL:  The patient is in no distress.  VITAL SIGNS:  Blood pressure initially 189/90, heart rate 72 and regular,  afebrile.  HEENT:  Eyes are unremarkable.  Pupils equal, round, and reactive to light.  Fundi within normal limits.  Oral mucosa unremarkable.  NECK:  No jugular venous distension.  Wave form within normal limits,  carotid upstroke brisk and symmetrical, no bruise, no thyromegaly.  LYMPHATICS:  No cervical, axillary, or inguinal adenopathy.  LUNGS:  Clear to auscultation bilaterally.  BACK:  No costovertebral angle tenderness.  CHEST:  Unremarkable.  HEART:  PMI not displaced or sustained.  S1 and S2 within normal limits.  No  S3, no S4, no murmurs.  ABDOMEN:  Obese; positive bowel sounds, normal in frequency and pitch; no  bruits, rebound, guarding; no midline pulsatile mass; no organomegaly.  SKIN:  No rashes, no nodules.  EXTREMITIES:  2+ pulses throughout.  No edema, cyanosis, clubbing.  NEUROLOGIC:  Oriented to person, place, and time.  Cranial nerves 2-12  grossly  intact.  Motor grossly intact.   EKG:  Sinus bradycardia, rate 57, axis within normal limits, intervals  within normal limits, lateral T-wave inversions, I, aVL, V5, and V6 without  old EKGs for comparison.   Chest x-ray:  No acute disease.   LABORATORY DATA:  WBC 6.3, hemoglobin 13.4, INR 0.9, sodium 139, potassium  3.5, chloride 105, BUN 28, creatinine 2.7, AST 24, ALT 20, alkaline  phosphatase 101, troponin 0.02.   ASSESSMENT AND PLAN:  1. Chest pain:  The patient's chest pain is worrisome for unstable angina.     He certainly has risk factors.  I would put his pretest probability of     having obstructive coronary disease leading to his symptoms at     approximately 60%.  Typically, I would suggest cardiac catheterization in     this situation.  However, he has a creatinine of 2.7 and would most     likely have at least temporary if not permanent renal failure with this.     Therefore, if he continues to have no objective evidence of ischemia and     no further chest pain, given the moderately high probability of no     disease, I would screen him with a stress perfusion study.  I would     perform an adenosine Cardiolite.  I have discussed at great length the     risks and benefits of this approach with the patient and his family, and     they agree to proceed.  Certainly, if his Cardiolite is abnormal or he     has any objective evidence of ischemia or recurrent pain, we would need     to go ahead with cardiac catheterization.  2. Diabetes:  I will continue his medications as listed.  We will hold his     Actos in the morning to keep him NPO for his stress test.  He will have     sliding-scale insulin coverage.  3. Renal insufficiency:  I will hold his diuretics for now and try to     hydrate his kidney in case he  does have cardiac catheterization.  I will     continue the Diovan since he is followed closely by Dr. Dagoberto Ligas and Dr.     Kathrene Bongo,    and they have chosen to  continue this.  We may want to hold this in     anticipation of catheterization if his stress test is abnormal.  4. Gout:  He will continue allopurinol.                                               Rollene Rotunda, M.D.    JH/MEDQ  D:  08/16/2003  T:  08/16/2003  Job:  [id number]   cc:   [name], M.D.  [address], [address]  [place]  [place] [address]  Fax: [phone]

## 2010-10-09 NOTE — Discharge Summary (Signed)
NAME:  [name] [name], [name] [name] NO.:  [id number]   MEDICAL RECORD NO.:  [id number]          PATIENT TYPE:  INP   LOCATION:  4710                         FACILITY:  [facility]   PHYSICIAN:  Duke Salvia, M.D.  DATE OF BIRTH:  November 08, 1940   DATE OF ADMISSION:  11/05/2004  DATE OF DISCHARGE:  11/06/2004                                 DISCHARGE SUMMARY   DISCHARGE DIAGNOSIS:  1.  Discharging day one status post implantation of St. Jude Atlas Plus VR      cardioverter defibrillator.  2.  Recent admission October 28, 2004, for exacerbation of congestive heart      failure with a BNP on admission of greater than 3200 diuresing 7 pounds      during the hospitalization.  3.  Echocardiogram October 28, 2004, with ejection fraction 10-20%.  4.  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   SECONDARY DIAGNOSIS:  1.  History of severe three vessel coronary artery disease status post      coronary artery bypass graft surgery and mitral valve annuloplasty August 31, 2004.  2.  Class II congestive heart failure symptoms well compensated.  3.  Post coronary artery bypass graft surgery atrial fibrillation resolved.  4.  Dyslipidemia.  5.  Hypertension.  6.  Chronic renal insufficiency.  7.  Gout.  8.  Anemia.  9.  Chronic Coumadin therapy.  10. History of prostate cancer status post seed implants.  11. Bilateral knee arthroscopies.   PROCEDURE:  November 05, 2004, implantation of St. Jude cardioverter  defibrillator with successful defibrillator threshold study less than or  equal to 15 joules, Dr. Sherryl Manges.   DISPOSITION:  [name] [name] is discharged post procedure day one.  He is  achieving 96% oxygen saturation on room air, blood pressure 134/77,  respirations 16, pulse 85 and regular.  The patient has had no rub.  His  incision is healing nicely without erythema, drainage, or  ecchymosis.  Chest  x-ray shows small bilateral effusion.   DISCHARGE MEDICATIONS:  The patient goes home on the following medications:  1.  Diovan 80 mg b.i.d., this is a new medication, and the patient will be      given a prescription for this.  2.  Allopurinol 100 mg daily.  3.  Glucotrol 5 mg b.i.d.  4.  Lasix 40 mg b.i.d.  5.  Potassium chloride 20 mEq daily.  6.  Hectorol 0.5 mcg daily.  7.  Lipitor 80 mg daily at bedtime.  8.  Toprol 150 mg daily.  9.  Coumadin 5 mg Monday, Wednesday, Friday, and Sunday, and 2.5 mg Tuesday,      Thursday and Saturday, he is to restart today.   FOLLOW UP:  [facility] at [address] at  1.  Coumadin Clinic June 21 at 9 a.m., also a BMET will be taken.  2.  CHF clinic June 29 at 3:30 p.m.  3.  ICD Clinic July 5 at 10:15 a.m.  4.  Dr.  Graciela Husbands in September 2006, Dr. Odessa Fleming office will call with that      appointment.   DISCHARGE INSTRUCTIONS:  Diet low sodium low cholesterol diet.  He is asked  to keep his incision dry for the next seven days.  He is to sponge bath  until June 22.  Mobility sheet has been given to the patient describing  movement of the left arm.   BRIEF HISTORY:  [name] [name] is a 70 year old male with a history of  severe three vessel coronary artery disease and is status post coronary  artery bypass graft with mitral valve annuloplasty August 31, 2004.  He was  admitted on June 7 with progressive fatigue and dyspnea for the preceding  seven days, his BNP was greater than 3200.  The patient has a history of  diabetes, chronic renal insufficiency, with a baseline creatinine of 2.8.  He has hypertension and dyslipidemia.  He has had successful diuresis during  his June 7 admission.  He was ruled out for myocardial infarction and  discharged on June 9 to return for implantation of cardioverter  defibrillator.  The patient had an echocardiogram done on June 7 which  showed ejection fraction 10-20% with  severe, diffuse left ventricular  hypokinesis.  This echo is well out from his coronary artery bypass graft  surgery in April and demonstrates continued depression of his ejection  fraction.  This and the fact that he has had a prior myocardial infarction  qualifies him for implantation of cardioverter defibrillator.  This was  planned on an elective basis June 15.   HOSPITAL COURSE:  The patient presented June 15 and underwent implantation  of cardioverter defibrillator the same day by Dr. Sherryl Manges.  He had no  post  procedure complications and he is discharged with the medications  dictated above and with extensive follow up with [facility].      Debbora Lacrosse   GM/MEDQ  D:  11/06/2004  T:  11/06/2004  Job:  [id number]   cc:   [name], M.D.  [address]  [place]  [place] [address]  Fax: [phone]   Cecille Aver, M.D.  [address]  [place]  [place] [address]  Fax: [phone]

## 2010-10-09 NOTE — Discharge Summary (Signed)
NAME:  [name] [name], [name] [name] NO.:  [id number]   MEDICAL RECORD NO.:  [id number]                   PATIENT TYPE:  INP   LOCATION:  0353                                 FACILITY:  [facility]   PHYSICIAN:  Rollene Rotunda, M.D.                DATE OF BIRTH:  1940-11-20   DATE OF ADMISSION:  08/16/2003  DATE OF DISCHARGE:  08/20/2003                                 DISCHARGE SUMMARY   BRIEF HISTORY:  This is a pleasant 70 year old male with no previous history  of coronary artery disease but a history of diabetes and hypertension as  well as renal insufficiency.  He was admitted to [facility] on  March 25 for further evaluation of chest pain.   PAST MEDICAL HISTORY:  As noted, the patient does have a history of diabetes  mellitus, hypertension, gout, prostate cancer, chronic renal insufficiency  and he is status post radiation implants for prostate cancer.   ALLERGIES:  No known drug allergies.   SOCIAL HISTORY:  This patient quit smoking in 1987, he has been married 39  years, he has two children and two grandchildren and he is a retired Physicist, medical.   FAMILY HISTORY:  Noncontributory for coronary artery disease.   HOSPITAL COURSE:  As noted, this patient was admitted to [facility] for further evaluation of chest pain.  He subsequently ruled in for  a non Q wave MI.  He was treated initially with heparin and nitroglycerin  and later Integrilin was added with plans to proceed with cardiac  catheterization. The patient was pain free following presentation therefore  he was not taken to the cath lab emergently. He was scheduled for a  catheterization on Monday, August 19, 2003; however, there were concerns  about his kidney secondary to chronic renal insufficiency.   A renal consult was obtained. They made some recommendations regarding  hydration and medications prior to the procedure; however, they still felt  that the patient would be at  high risk for renal failure due to the  catheterization.   The patient was seen by Dr. Andee Lineman on Saturday and Sunday. He felt that a  more conservative approach would be indicated consisting of an adenosine  Cardiolite and 2-D echo. Based on these results, a decision could be made  whether to proceed with cardiac catheterization.   The adenosine Cardiolite was performed August 19, 2003 as was the echo. The  radiologist interpreted the adenosine Cardiolite as having an ejection  fraction of 40% with a small area of apical ischemia.  Dr. Myrtis Ser over read  the study and he felt that the skin showed a question of a fixed defect seen  in one view only in the anterior septal wall but no ischemia. The 2-D echo  revealed an ejection fraction of 55-65% with no wall motion abnormalities  and mild MR.   Based on  these studies, a decision was made to manage the patient  conservatively with medication. If he continued to have pain or symptoms,  the possibility of cardiac catheterization would be revisited. Arrangements  were made to discharge the patient home in improved condition on August 20, 2003 following adjustments of his medications.   LABORATORY DATA:  Please see results of adenosine Cardiolite and echo  performed above. A CBC on the day of discharge revealed hemoglobin 11,  hematocrit 32.4, WBC 6.6000, platelets 120,000, BUN 26, creatinine 2.7,  potassium 4.2.  As noted, he had elevated cardiac enzymes which peaked on  March 26 with a troponin of 4.82. A CK was 253, MB 17.5, index was 6.9.  A  lipid profile revealed cholesterol 166, triglycerides 53. HDL 45, LDL was  110. The patient was on Lipitor prior to admission.  A BNP was mildly  elevated at 295, TSH was within normal limits.  A chest x-ray showed no  active disease.   DISCHARGE MEDICATIONS:  1. Imdur 60 mg daily.  2. Lopressor 100 mg b.i.d.  3. Plavix 75 mg daily.  4. Lipitor 80 mg daily.  5. Norvasc 5 mg daily.  6. Enteric  coated aspirin 325 mg daily.  7. Nitroglycerin p.r.n. for chest pain.  8. Zyloprim 100 mg daily.  9. Actos 45 mg daily.  10.      Glucotrol 5 mg b.i.d.  11.      Lasix was held as was his propranolol, Diovan and diltiazem.  He     was told to take Tylenol as needed for pain.   The patient was told to avoid any strenuous activity until cleared by his  cardiologist. He was to be on a low salt, low fat diabetic diet. He will see  Dr. Dagoberto Ligas as needed or as scheduled, Dr. Antoine Poche April 18 at 2:30 p.m.,  Dr. Arlean Hopping as instructed.   PROBLEM LIST AT TIME OF DISCHARGE:  1. Non Q wave MI.  2. Adenosine Cardiolite performed August 19, 2003, please see results as     noted above.  3. 2-D echocardiogram, August 19, 2003.  Ejection fraction 55 to 65% with     mild mitral regurgitation, no wall motion abnormalities.  4. Diabetes mellitus.  5. Mild congestive heart failure.  6. Mildly decreased platelets.  7. History of prostate cancer.  8. Remote tobacco history.  9. Mild anemia.  10.      Renal insufficiency which is chronic.  11.      History of elevated lipids on Lipitor prior to admission.   ADDENDUM:  As noted, if the patient continued to have any symptoms a cardiac  catheterization would be reconsidered.  Also we could consider placing the  patient on __________ as he is on both Norvasc and Lipitor.     Delton See, P.A. [facility]                  Rollene Rotunda, M.D.    DR/MEDQ  D:  08/20/2003  T:  08/20/2003  Job:  [id number]   cc:   [name], M.D.  [address]  [place]  [place] [address]  Fax: [phone]   Maree Krabbe, M.D.  Fax: [phone]

## 2010-10-13 ENCOUNTER — Other Ambulatory Visit (HOSPITAL_COMMUNITY): Payer: Self-pay | Admitting: Nephrology

## 2010-10-13 ENCOUNTER — Ambulatory Visit (HOSPITAL_COMMUNITY)
Admission: RE | Admit: 2010-10-13 | Discharge: 2010-10-13 | Disposition: A | Discharge status: Home / Self Care | Payer: Medicare Other | Source: Ambulatory Visit | Attending: Nephrology | Admitting: Nephrology

## 2010-10-13 DIAGNOSIS — C61 Malignant neoplasm of prostate: Secondary | ICD-10-CM | POA: Insufficient documentation

## 2010-10-13 DIAGNOSIS — Y849 Medical procedure, unspecified as the cause of abnormal reaction of the patient, or of later complication, without mention of misadventure at the time of the procedure: Secondary | ICD-10-CM | POA: Insufficient documentation

## 2010-10-13 DIAGNOSIS — E119 Type 2 diabetes mellitus without complications: Secondary | ICD-10-CM | POA: Insufficient documentation

## 2010-10-13 DIAGNOSIS — T82898A Other specified complication of vascular prosthetic devices, implants and grafts, initial encounter: Secondary | ICD-10-CM | POA: Insufficient documentation

## 2010-10-13 DIAGNOSIS — N186 End stage renal disease: Secondary | ICD-10-CM

## 2010-10-13 DIAGNOSIS — I120 Hypertensive chronic kidney disease with stage 5 chronic kidney disease or end stage renal disease: Secondary | ICD-10-CM | POA: Insufficient documentation

## 2010-10-13 MED ORDER — IOHEXOL 300 MG/ML  SOLN
250.0000 mL | Freq: Once | INTRAMUSCULAR | Status: AC | PRN
  Administered 2010-10-13: 40 mL via INTRAVENOUS

## 2010-10-29 ENCOUNTER — Encounter: Payer: Self-pay | Admitting: Cardiology

## 2010-11-19 ENCOUNTER — Encounter: Payer: Self-pay | Admitting: *Deleted

## 2010-11-24 ENCOUNTER — Encounter: Payer: Self-pay | Admitting: *Deleted

## 2010-12-18 ENCOUNTER — Encounter: Payer: Self-pay | Admitting: Cardiology

## 2011-01-05 ENCOUNTER — Encounter: Payer: Self-pay | Admitting: *Deleted

## 2011-01-21 ENCOUNTER — Ambulatory Visit (INDEPENDENT_AMBULATORY_CARE_PROVIDER_SITE_OTHER): Payer: Medicare Other | Admitting: Cardiology

## 2011-01-21 ENCOUNTER — Encounter: Payer: Self-pay | Admitting: Cardiology

## 2011-01-21 DIAGNOSIS — I10 Essential (primary) hypertension: Secondary | ICD-10-CM

## 2011-01-21 DIAGNOSIS — I2589 Other forms of chronic ischemic heart disease: Secondary | ICD-10-CM

## 2011-01-21 DIAGNOSIS — I739 Peripheral vascular disease, unspecified: Secondary | ICD-10-CM

## 2011-01-21 DIAGNOSIS — I25810 Atherosclerosis of coronary artery bypass graft(s) without angina pectoris: Secondary | ICD-10-CM

## 2011-01-21 NOTE — Patient Instructions (Signed)
Your physician wants you to follow-up in: 12 months with Dr Hoyle Barr will receive a reminder letter in the mail two months in advance. If you don't receive a letter, please call our office to schedule the follow-up appointment.   Your physician recommends that you continue on your current medications as directed. Please refer to the Current Medication list given to you today.   Your physician has requested that you have an ankle brachial index (ABI). During this test an ultrasound and blood pressure cuff are used to evaluate the arteries that supply the arms and legs with blood. Allow thirty minutes for this exam. There are no restrictions or special instructions.

## 2011-01-21 NOTE — Assessment & Plan Note (Signed)
The patient has no new sypmtoms.  No further cardiovascular testing is indicated.  We will continue with aggressive risk reduction and meds as listed.  He did have a stress test last Feb

## 2011-01-21 NOTE — Assessment & Plan Note (Signed)
He seems to be euvolemic.  At this point, no change in therapy is indicated.  We have reviewed salt and fluid restrictions.  No further cardiovascular testing is indicated.   

## 2011-01-21 NOTE — Assessment & Plan Note (Signed)
I will get ABIs to evaluate.

## 2011-01-21 NOTE — Assessment & Plan Note (Signed)
The blood pressure is at target. No change in medications is indicated. We will continue with therapeutic lifestyle changes (TLC).  

## 2011-01-21 NOTE — Progress Notes (Signed)
HPI The patient returned for one year followup. Since I last saw him he has done well with no new cardiovascular events. He denies any chest pressure, neck or arm discomfort. He has had no palpitations, presyncope or syncope. He has had no PND or orthopnea. He gets no weight gain or edema. He tolerates dialysis. He does get fatigued and doesn't exercise routinely because of this. He was recently noted to have reduced peripheral pulses.  No Known Allergies  Current Outpatient Prescriptions  Medication Sig Dispense Refill  . allopurinol (ZYLOPRIM) 100 MG tablet Take 100 mg by mouth as needed.        Marland Kitchen aspirin 81 MG tablet Take 81 mg by mouth daily.        . B Complex-C-Folic Acid (NEPHRO-VITE PO) Take 1 tablet by mouth daily.        . calcium acetate (PHOSLO) 667 MG capsule Take 1,334 mg by mouth 3 (three) times daily.        . colchicine 0.6 MG tablet Take 0.6 mg by mouth as needed.        . felodipine (PLENDIL) 10 MG 24 hr tablet Take 10 mg by mouth daily.        . folic acid-vitamin b complex-vitamin c-selenium-zinc (DIALYVITE) 3 MG TABS Take 1 tablet by mouth daily.        Marland Kitchen glipiZIDE (GLUCOTROL XL) 2.5 MG 24 hr tablet Take 2.5 mg by mouth daily.        Marland Kitchen lanthanum (FOSRENOL) 1000 MG chewable tablet Chew 1,000 mg by mouth daily.        . metoprolol (TOPROL-XL) 50 MG 24 hr tablet Take 100 mg by mouth at bedtime. Take  1/2 tab at bedtime      . pravastatin (PRAVACHOL) 20 MG tablet Take 20 mg by mouth daily.        Marland Kitchen amLODipine-benazepril (LOTREL) 10-40 MG per capsule Take 1 capsule by mouth daily.          Past Medical History  Diagnosis Date  . Cancer     personal history of prostate cancer  . Hypertension     benign essential  . Gout   . Coronary artery disease   . Ischemic cardiomyopathy   . CKD (chronic kidney disease)     dialysis dependant  . Diabetes mellitus   . Dyslipidemia     Past Surgical History  Procedure Date  . Icd     placed on November 05, 2004, St. Jude  single-chamber Atlas [phone]  . Coronary artery bypass graft 2006    LIMA to the LAD, vein graft to the first OM, vein graft to the PDA, and vein graft to the diagonal  . Mitral valve repair     with a #28 annuloplasty ring  . Dialysis fistula creation   . Dg angio av shunt*r*   . Sp av dialysis shunt intro needle *r*     ROS:  As stated in the HPI and negative for all other systems.  PHYSICAL EXAM BP 137/70  Pulse 68  Ht 5\' 7"  (1.702 m)  Wt 138 lb 12.8 oz (62.959 kg)  BMI 21.74 kg/m2 GENERAL:  Well appearing HEENT:  Pupils equal round and reactive, fundi not visualized, oral mucosa unremarkable NECK:  No jugular venous distention, waveform within normal limits, carotid upstroke brisk and symmetric, no bruits, no thyromegaly LYMPHATICS:  No cervical, inguinal adenopathy LUNGS:  Clear to auscultation bilaterally BACK:  No CVA tenderness CHEST:  Unremarkable HEART:  PMI not displaced or sustained,S1 and S2 within normal limits, no S3, no S4, no clicks, no rubs, no murmurs, thrill right upper chest ABD:  Flat, positive bowel sounds normal in frequency in pitch, no bruits, no rebound, no guarding, no midline pulsatile mass, no hepatomegaly, no splenomegaly EXT:  2 plus pulses upper, right upper arm dialysis fistula , no edema, no cyanosis no clubbing, absent DP, PT left leg SKIN:  No rashes no nodules NEURO:  Cranial nerves II through XII grossly intact, motor grossly intact throughout PSYCH:  Cognitively intact, oriented to person place and time  EKG: Sinus rhythm, rate 68, premature atrial contractions, lateral T wave inversions unchanged from previous  ASSESSMENT AND PLAN

## 2011-02-16 ENCOUNTER — Encounter (INDEPENDENT_AMBULATORY_CARE_PROVIDER_SITE_OTHER): Payer: Medicare Other | Admitting: *Deleted

## 2011-02-16 DIAGNOSIS — E1159 Type 2 diabetes mellitus with other circulatory complications: Secondary | ICD-10-CM

## 2011-02-16 DIAGNOSIS — I739 Peripheral vascular disease, unspecified: Secondary | ICD-10-CM

## 2011-02-16 DIAGNOSIS — I25810 Atherosclerosis of coronary artery bypass graft(s) without angina pectoris: Secondary | ICD-10-CM

## 2011-02-19 LAB — DIFFERENTIAL
Basophils Relative: 0
Eosinophils Absolute: 0.3
Lymphs Abs: 1.4
Monocytes Relative: 15 — ABNORMAL HIGH
Neutro Abs: 8.3 — ABNORMAL HIGH
Neutrophils Relative %: 71

## 2011-02-19 LAB — CBC
HCT: 32 — ABNORMAL LOW
HCT: 34.4 — ABNORMAL LOW
HCT: 34.4 — ABNORMAL LOW
HCT: 34.6 — ABNORMAL LOW
HCT: 35.7 — ABNORMAL LOW
Hemoglobin: 11.2 — ABNORMAL LOW
Hemoglobin: 11.4 — ABNORMAL LOW
Hemoglobin: 11.5 — ABNORMAL LOW
Hemoglobin: 11.9 — ABNORMAL LOW
MCHC: 32.5
MCHC: 32.8
MCHC: 33.2
MCHC: 33.3
MCHC: 33.6
MCV: 93.2
MCV: 93.3
MCV: 93.4
MCV: 93.8
MCV: 93.9
MCV: 94.3
Platelets: 113 — ABNORMAL LOW
Platelets: 122 — ABNORMAL LOW
Platelets: 125 — ABNORMAL LOW
Platelets: 125 — ABNORMAL LOW
Platelets: 143 — ABNORMAL LOW
Platelets: 156
RBC: 3.5 — ABNORMAL LOW
RBC: 3.62 — ABNORMAL LOW
RBC: 3.71 — ABNORMAL LOW
RBC: 3.91 — ABNORMAL LOW
RDW: 15.7 — ABNORMAL HIGH
RDW: 16 — ABNORMAL HIGH
RDW: 16.3 — ABNORMAL HIGH
WBC: 10.1
WBC: 10.3
WBC: 11.8 — ABNORMAL HIGH
WBC: 12.3 — ABNORMAL HIGH

## 2011-02-19 LAB — RENAL FUNCTION PANEL
Albumin: 2.6 — ABNORMAL LOW
Albumin: 2.9 — ABNORMAL LOW
Albumin: 3.4 — ABNORMAL LOW
BUN: 65 — ABNORMAL HIGH
BUN: 72 — ABNORMAL HIGH
BUN: 72 — ABNORMAL HIGH
BUN: 80 — ABNORMAL HIGH
CO2: 20
CO2: 24
Calcium: 8.5
Calcium: 8.7
Calcium: 8.7
Calcium: 9
Calcium: 9.2
Chloride: 103
Chloride: 98
Creatinine, Ser: 11.83 — ABNORMAL HIGH
Creatinine, Ser: 7.25 — ABNORMAL HIGH
Creatinine, Ser: 8.17 — ABNORMAL HIGH
Creatinine, Ser: 9.66 — ABNORMAL HIGH
GFR calc Af Amer: 8 — ABNORMAL LOW
GFR calc Af Amer: 9 — ABNORMAL LOW
GFR calc non Af Amer: 7 — ABNORMAL LOW
GFR calc non Af Amer: 7 — ABNORMAL LOW
GFR calc non Af Amer: 8 — ABNORMAL LOW
Glucose, Bld: 115 — ABNORMAL HIGH
Glucose, Bld: 122 — ABNORMAL HIGH
Glucose, Bld: 125 — ABNORMAL HIGH
Glucose, Bld: 129 — ABNORMAL HIGH
Glucose, Bld: 156 — ABNORMAL HIGH
Phosphorus: 5.3 — ABNORMAL HIGH
Phosphorus: 6.4 — ABNORMAL HIGH
Phosphorus: 6.5 — ABNORMAL HIGH
Phosphorus: 6.7 — ABNORMAL HIGH
Potassium: 3.5
Potassium: 3.6
Potassium: 3.9
Potassium: 4
Sodium: 136
Sodium: 137
Sodium: 138
Sodium: 143

## 2011-02-19 LAB — IRON AND TIBC
Saturation Ratios: 30
TIBC: 211 — ABNORMAL LOW
UIBC: 147

## 2011-02-19 LAB — COMPREHENSIVE METABOLIC PANEL
ALT: 9
AST: 21
Albumin: 3.4 — ABNORMAL LOW
CO2: 18 — ABNORMAL LOW
Calcium: 9.1
Creatinine, Ser: 7.79 — ABNORMAL HIGH
GFR calc Af Amer: 9 — ABNORMAL LOW
GFR calc non Af Amer: 7 — ABNORMAL LOW
Sodium: 141
Total Protein: 6.3

## 2011-02-19 LAB — POCT I-STAT 4, (NA,K, GLUC, HGB,HCT)
Hemoglobin: 13.6
Potassium: 4

## 2011-02-19 LAB — URINALYSIS, ROUTINE W REFLEX MICROSCOPIC
Hgb urine dipstick: NEGATIVE
Leukocytes, UA: NEGATIVE
Protein, ur: 100 — AB
Specific Gravity, Urine: 1.011
Urobilinogen, UA: 0.2

## 2011-02-19 LAB — BASIC METABOLIC PANEL
BUN: 39 — ABNORMAL HIGH
BUN: 74 — ABNORMAL HIGH
CO2: 21
CO2: 25
Calcium: 8.6
Calcium: 9.2
Chloride: 101
Chloride: 109
Chloride: 99
Creatinine, Ser: 7.29 — ABNORMAL HIGH
Creatinine, Ser: 7.76 — ABNORMAL HIGH
GFR calc Af Amer: 9 — ABNORMAL LOW
GFR calc Af Amer: 9 — ABNORMAL LOW
GFR calc non Af Amer: 8 — ABNORMAL LOW
Glucose, Bld: 101 — ABNORMAL HIGH
Glucose, Bld: 118 — ABNORMAL HIGH
Potassium: 3.7
Potassium: 4
Sodium: 137
Sodium: 138

## 2011-02-19 LAB — POCT CARDIAC MARKERS: Troponin i, poc: <0.05

## 2011-02-19 LAB — SYNOVIAL CELL COUNT + DIFF, W/ CRYSTALS
Eosinophils-Synovial: 0
Neutrophil, Synovial: 84 — ABNORMAL HIGH
WBC, Synovial: 41930 — ABNORMAL HIGH

## 2011-02-19 LAB — BODY FLUID CULTURE

## 2011-02-19 LAB — CK TOTAL AND CKMB (NOT AT ARMC)
CK, MB: 2.1
Relative Index: 1.8
Total CK: 116

## 2011-02-19 LAB — APTT: aPTT: 32

## 2011-02-19 LAB — URINE MICROSCOPIC-ADD ON

## 2011-02-19 LAB — GLUCOSE, SEROUS FLUID: Glucose, Fluid: 60

## 2011-02-19 LAB — FERRITIN: Ferritin: 263 (ref 22–322)

## 2011-02-19 LAB — TROPONIN I: Troponin I: 0.03

## 2011-02-19 LAB — TSH: TSH: 2.095

## 2011-02-19 LAB — HEPARIN INDUCED THROMBOCYTOPENIA PNL: Heparin Induced Plt Ab: NEGATIVE

## 2011-05-14 ENCOUNTER — Encounter: Payer: Self-pay | Admitting: Internal Medicine

## 2011-05-31 ENCOUNTER — Encounter: Payer: Self-pay | Admitting: *Deleted

## 2011-06-17 ENCOUNTER — Encounter: Payer: Self-pay | Admitting: Internal Medicine

## 2011-06-17 ENCOUNTER — Ambulatory Visit (INDEPENDENT_AMBULATORY_CARE_PROVIDER_SITE_OTHER): Payer: Medicare Other | Admitting: *Deleted

## 2011-06-17 DIAGNOSIS — Z95810 Presence of automatic (implantable) cardiac defibrillator: Secondary | ICD-10-CM

## 2011-06-17 DIAGNOSIS — I428 Other cardiomyopathies: Secondary | ICD-10-CM

## 2011-06-17 LAB — REMOTE ICD DEVICE
BATTERY VOLTAGE: 2.55 V
RV LEAD IMPEDENCE ICD: 375 Ohm
TZAT-0001SLOWVT: 1
TZAT-0004SLOWVT: 8
TZAT-0012SLOWVT: 200 ms
TZAT-0013FASTVT: 1
TZAT-0018FASTVT: NEGATIVE
TZON-0003FASTVT: 285 ms
TZON-0005FASTVT: 6
TZST-0001FASTVT: 2
TZST-0001FASTVT: 5
TZST-0001SLOWVT: 3
TZST-0003FASTVT: 36 J
TZST-0003FASTVT: 36 J
TZST-0003SLOWVT: 15 J
TZST-0003SLOWVT: 36 J
TZST-0003SLOWVT: 36 J
VENTRICULAR PACING ICD: 1 pct

## 2011-06-22 ENCOUNTER — Encounter: Payer: Self-pay | Admitting: *Deleted

## 2011-06-22 NOTE — Progress Notes (Signed)
Remote icd check  

## 2011-07-05 ENCOUNTER — Telehealth: Payer: Self-pay | Admitting: Cardiology

## 2011-07-05 NOTE — Telephone Encounter (Signed)
Spoke with wife, remote transmission received.  She is aware of next remote due 4-25.  ROV in July with Dr Graciela Husbands.  She is requesting it be the same day as Dr Antoine Poche.

## 2011-07-05 NOTE — Telephone Encounter (Signed)
New Problem:     Patient's wife called in and was concerned and confused about the last time they sent a remote check in, because they received a letter saying it wasn't received. Also she has different appointment times listed that what we have on our EPIC records. Please call back.

## 2011-09-16 ENCOUNTER — Encounter: Payer: Medicare Other | Admitting: *Deleted

## 2011-09-21 ENCOUNTER — Encounter: Payer: Self-pay | Admitting: Internal Medicine

## 2011-09-21 ENCOUNTER — Ambulatory Visit (INDEPENDENT_AMBULATORY_CARE_PROVIDER_SITE_OTHER): Payer: Medicare Other | Admitting: *Deleted

## 2011-09-21 DIAGNOSIS — I428 Other cardiomyopathies: Secondary | ICD-10-CM

## 2011-09-22 LAB — REMOTE ICD DEVICE
BATTERY VOLTAGE: 2.55 V
BRDY-0002RV: 40 {beats}/min
DEV-0020ICD: NEGATIVE
DEVICE MODEL ICD: 263295
HV IMPEDENCE: 30 Ohm
TZAT-0001FASTVT: 1
TZAT-0001SLOWVT: 1
TZAT-0004FASTVT: 8
TZAT-0004SLOWVT: 8
TZAT-0012FASTVT: 200 ms
TZAT-0013FASTVT: 1
TZAT-0018SLOWVT: NEGATIVE
TZAT-0020FASTVT: 1 ms
TZON-0003SLOWVT: 350 ms
TZON-0005FASTVT: 6
TZON-0010SLOWVT: 80 ms
TZST-0001FASTVT: 3
TZST-0001FASTVT: 4
TZST-0001FASTVT: 5
TZST-0001SLOWVT: 2
TZST-0001SLOWVT: 3
TZST-0001SLOWVT: 5
TZST-0003FASTVT: 25 J
TZST-0003FASTVT: 36 J
TZST-0003FASTVT: 36 J
TZST-0003SLOWVT: 15 J
TZST-0003SLOWVT: 36 J

## 2011-09-22 NOTE — Progress Notes (Signed)
ICD remote 

## 2011-10-08 ENCOUNTER — Ambulatory Visit
Admission: RE | Admit: 2011-10-08 | Discharge: 2011-10-08 | Disposition: A | Discharge status: Home / Self Care | Payer: Medicare Other | Source: Ambulatory Visit | Attending: Nephrology | Admitting: Nephrology

## 2011-10-08 ENCOUNTER — Other Ambulatory Visit: Payer: Self-pay | Admitting: Nephrology

## 2011-10-08 DIAGNOSIS — Z0000 Encounter for general adult medical examination without abnormal findings: Secondary | ICD-10-CM

## 2011-10-12 ENCOUNTER — Encounter: Payer: Self-pay | Admitting: *Deleted

## 2012-01-11 ENCOUNTER — Encounter: Payer: Self-pay | Admitting: Internal Medicine

## 2012-01-11 ENCOUNTER — Ambulatory Visit (INDEPENDENT_AMBULATORY_CARE_PROVIDER_SITE_OTHER): Payer: Medicare Other | Admitting: Internal Medicine

## 2012-01-11 VITALS — BP 139/72 | HR 72 | Ht 67.0 in | Wt 142.8 lb

## 2012-01-11 DIAGNOSIS — I2589 Other forms of chronic ischemic heart disease: Secondary | ICD-10-CM

## 2012-01-11 DIAGNOSIS — Z95810 Presence of automatic (implantable) cardiac defibrillator: Secondary | ICD-10-CM

## 2012-01-11 LAB — ICD DEVICE OBSERVATION
DEV-0020ICD: NEGATIVE
RV LEAD AMPLITUDE: 12.1 mv
RV LEAD THRESHOLD: 0.5 V
TOT-0007: 2
TOT-0008: 0
TOT-0010: 53
TZAT-0001SLOWVT: 1
TZAT-0004SLOWVT: 8
TZAT-0012SLOWVT: 200 ms
TZAT-0013FASTVT: 1
TZAT-0018FASTVT: NEGATIVE
TZAT-0019FASTVT: 7.5 V
TZAT-0019SLOWVT: 7.5 V
TZAT-0020FASTVT: 1 ms
TZON-0003FASTVT: 285 ms
TZON-0004FASTVT: 12
TZON-0004SLOWVT: 12
TZON-0005FASTVT: 6
TZON-0010FASTVT: 80 ms
TZST-0001FASTVT: 2
TZST-0001FASTVT: 5
TZST-0001SLOWVT: 3
TZST-0003FASTVT: 36 J
TZST-0003SLOWVT: 15 J
TZST-0003SLOWVT: 36 J

## 2012-01-11 NOTE — Patient Instructions (Signed)
Remote monitoring is used to monitor your Pacemaker of ICD from home. This monitoring reduces the number of office visits required to check your device to one time per year. It allows [place] to keep an eye on the functioning of your device to ensure it is working properly. You are scheduled for a device check from home on 02/14/12. You may send your transmission at any time that day. If you have a wireless device, the transmission will be sent automatically. After your physician reviews your transmission, you will receive a postcard with your next transmission date.  Your physician wants you to follow-up in: 6 months with Dr. Antoine Poche & 1 year with Dr. Graciela Husbands. You will receive a reminder letter in the mail two months in advance. If you don't receive a letter, please call our office to schedule the follow-up appointment.  Your physician recommends that you continue on your current medications as directed. Please refer to the Current Medication list given to you today.

## 2012-01-11 NOTE — Assessment & Plan Note (Signed)
Next clinically stable with his coronary artery disease without symptoms. Continue current medications

## 2012-01-11 NOTE — Assessment & Plan Note (Signed)
approaching ERI. The patient's device was interrogated.  The information was reviewed. No changes were made in the programming.

## 2012-01-11 NOTE — Progress Notes (Signed)
  HPI  [name] [name] is a 71 y.o. male seen in followup for an ICD implanted for primary prevention of ischemic heart disease. He is s/p CABG.  He as ESRD on HD He has had no shortness of breath, PND or orthopnea. He has had no weight gain or edema.    Past Medical History  Diagnosis Date  . Cancer     personal history of prostate cancer  . Hypertension     benign essential  . Gout   . Coronary artery disease   . Ischemic cardiomyopathy   . CKD (chronic kidney disease)     dialysis dependant  . Diabetes mellitus   . Dyslipidemia     Past Surgical History  Procedure Date  . Icd     placed on November 05, 2004, St. Jude single-chamber Atlas [phone]  . Coronary artery bypass graft 2006    LIMA to the LAD, vein graft to the first OM, vein graft to the PDA, and vein graft to the diagonal  . Mitral valve repair     with a #28 annuloplasty ring  . Dialysis fistula creation   . Dg angio av shunt*r*   . Sp av dialysis shunt intro needle *r*     Current Outpatient Prescriptions  Medication Sig Dispense Refill  . allopurinol (ZYLOPRIM) 100 MG tablet Take 100 mg by mouth as needed.        Marland Kitchen amLODipine-benazepril (LOTREL) 10-40 MG per capsule Take 1 capsule by mouth daily.        Marland Kitchen aspirin 81 MG tablet Take 81 mg by mouth daily.        . calcium acetate (PHOSLO) 667 MG capsule Take 1,334 mg by mouth 3 (three) times daily.        . colchicine 0.6 MG tablet Take 0.6 mg by mouth as needed.        . Doxercalciferol (HECTOROL IV) Inject 1 mg into the vein 3 (three) times a week.      . folic acid-vitamin b complex-vitamin c-selenium-zinc (DIALYVITE) 3 MG TABS Take 1 tablet by mouth daily.        Marland Kitchen glipiZIDE (GLUCOTROL XL) 2.5 MG 24 hr tablet Take 2.5 mg by mouth daily.        Marland Kitchen lanthanum (FOSRENOL) 1000 MG chewable tablet Chew 1,000 mg by mouth daily.        . metoprolol (TOPROL-XL) 50 MG 24 hr tablet Take 100 mg by mouth at bedtime. Take  1/2 tab at bedtime      . pravastatin (PRAVACHOL) 20  MG tablet Take 20 mg by mouth daily.          No Known Allergies  Review of Systems negative except from HPI and PMH  Physical Exam BP 139/72  Pulse 72  Ht 5\' 7"  (1.702 m)  Wt 142 lb 12.8 oz (64.774 kg)  BMI 22.37 kg/m2 Well developed and well nourished in no acute distress HENT normal E scleral and icterus clear Neck Supple JVP flat; carotids brisk and full Clear to ausculation Regular rate and rhythm, no murmurs gallops or rub Soft with active bowel sounds No clubbing cyanosis none Edema;  Dialysis graft R arm Alert and oriented, grossly normal motor and sensory function Skin Warm and Dry  Electrocardiogram demonstrates sinus rhythm at 71 with intervals 19/10-42 Axis is 42  Assessment and  Plan

## 2012-01-14 ENCOUNTER — Telehealth: Payer: Self-pay | Admitting: Cardiology

## 2012-01-14 NOTE — Telephone Encounter (Signed)
Please return call to pt wife Bonita Quin [phone]  Pt would like to know if he can have an electric blanket with device.

## 2012-01-14 NOTE — Telephone Encounter (Signed)
Spoke with [employer]).  She wanted to know if it was ok for him to use an electric blanket while @ dialysis.  I referred her to tech support @ [facility] [phone].

## 2012-01-18 ENCOUNTER — Ambulatory Visit: Payer: Medicare Other | Admitting: Cardiology

## 2012-02-14 ENCOUNTER — Ambulatory Visit (INDEPENDENT_AMBULATORY_CARE_PROVIDER_SITE_OTHER): Payer: Medicare Other | Admitting: *Deleted

## 2012-02-14 DIAGNOSIS — I2589 Other forms of chronic ischemic heart disease: Secondary | ICD-10-CM

## 2012-02-14 DIAGNOSIS — Z95810 Presence of automatic (implantable) cardiac defibrillator: Secondary | ICD-10-CM

## 2012-02-15 ENCOUNTER — Encounter: Payer: Self-pay | Admitting: Internal Medicine

## 2012-02-17 ENCOUNTER — Encounter: Payer: Self-pay | Admitting: *Deleted

## 2012-02-17 LAB — REMOTE ICD DEVICE
BATTERY VOLTAGE: 2.55 V
TZAT-0001SLOWVT: 1
TZAT-0004SLOWVT: 8
TZAT-0012SLOWVT: 200 ms
TZAT-0013FASTVT: 1
TZAT-0018FASTVT: NEGATIVE
TZAT-0020FASTVT: 1 ms
TZON-0003FASTVT: 285 ms
TZON-0004SLOWVT: 12
TZON-0005FASTVT: 6
TZST-0001FASTVT: 2
TZST-0001FASTVT: 5
TZST-0001SLOWVT: 3
TZST-0003FASTVT: 36 J
TZST-0003FASTVT: 36 J
TZST-0003SLOWVT: 15 J
TZST-0003SLOWVT: 36 J
TZST-0003SLOWVT: 36 J
VENTRICULAR PACING ICD: 1 pct

## 2012-03-01 ENCOUNTER — Encounter: Payer: Self-pay | Admitting: *Deleted

## 2012-03-20 ENCOUNTER — Encounter: Payer: Medicare Other | Admitting: *Deleted

## 2012-03-21 ENCOUNTER — Encounter: Payer: Self-pay | Admitting: *Deleted

## 2012-04-26 ENCOUNTER — Other Ambulatory Visit: Payer: Self-pay | Admitting: Nephrology

## 2012-04-26 ENCOUNTER — Ambulatory Visit
Admission: RE | Admit: 2012-04-26 | Discharge: 2012-04-26 | Disposition: A | Discharge status: Home / Self Care | Payer: Medicare Other | Source: Ambulatory Visit | Attending: Nephrology | Admitting: Nephrology

## 2012-04-26 DIAGNOSIS — R0781 Pleurodynia: Secondary | ICD-10-CM

## 2012-06-01 ENCOUNTER — Encounter: Payer: Self-pay | Admitting: *Deleted

## 2012-06-08 ENCOUNTER — Ambulatory Visit (INDEPENDENT_AMBULATORY_CARE_PROVIDER_SITE_OTHER): Payer: Self-pay | Admitting: *Deleted

## 2012-06-08 ENCOUNTER — Encounter: Payer: Self-pay | Admitting: Internal Medicine

## 2012-06-08 DIAGNOSIS — I428 Other cardiomyopathies: Secondary | ICD-10-CM

## 2012-06-08 DIAGNOSIS — Z95810 Presence of automatic (implantable) cardiac defibrillator: Secondary | ICD-10-CM

## 2012-06-09 LAB — REMOTE ICD DEVICE
BATTERY VOLTAGE: 2.55 V
DEV-0020ICD: NEGATIVE
TZAT-0001SLOWVT: 1
TZAT-0004SLOWVT: 8
TZAT-0012FASTVT: 200 ms
TZAT-0012SLOWVT: 200 ms
TZAT-0013FASTVT: 1
TZAT-0018FASTVT: NEGATIVE
TZAT-0019SLOWVT: 7.5 V
TZON-0003FASTVT: 285 ms
TZON-0004SLOWVT: 12
TZON-0005FASTVT: 6
TZON-0010SLOWVT: 80 ms
TZST-0001FASTVT: 2
TZST-0001FASTVT: 5
TZST-0001SLOWVT: 3
TZST-0003FASTVT: 36 J
TZST-0003FASTVT: 36 J
TZST-0003SLOWVT: 15 J
TZST-0003SLOWVT: 36 J
VENTRICULAR PACING ICD: 1 pct

## 2012-06-20 ENCOUNTER — Encounter: Payer: Self-pay | Admitting: *Deleted

## 2012-06-21 IMAGING — US IR SHUNTOGRAM/ FISTULAGRAM *R*
1 series · 2 of 2 positions shown · non-contrast
Comparison: 10/07/2009

CLINICAL DATA: end stage renal disease, decreased access flows
during dialysis

DIALYSIS SHUNTOGRAM
VENOUS ANGIOPLASTY
TECHNIQUE: An 18-gauge angiocatheter was placed   antegrade into
the outflow vein of the patient's right upper arm native straight
AV hemodialysis fistula for dialysis fistulography. The
angiocatheter and surrounding skin were then prepped with Betadine,
draped in usual sterile fashion, infiltrated locally with 1%
lidocaine.

[Series 1: ir shuntogram/ fistulagram *right* · 2 of 2 slices shown]
[im 1/2]
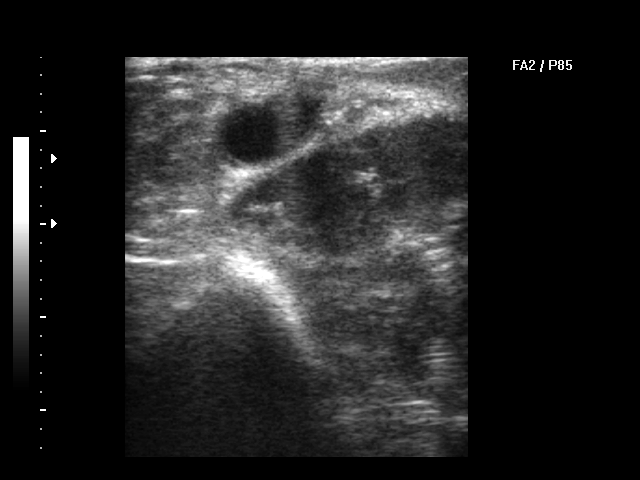
[im 2/2]
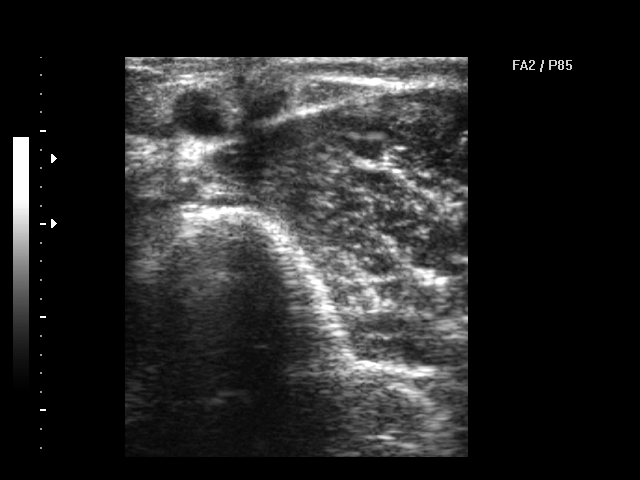

[2 of 2 positions shown; findings below may reference images not displayed]

Intravenous Fentanyl and Versed were administered as conscious
sedation during continuous cardiorespiratory monitoring by the
radiology RN, with a total moderate sedation time of 18 minutes.

The angiocatheter was removed.  The more central aspect of the
outflow vein was accessed retrograde with a 21-gauge micropuncture
needle under real time ultrasound guidance.  Ultrasound
documentation was stored.  The needle was exchanged over guide wire
for a transitional dilator, which allowed advancement of a Benson
wire.  The transitional dilator was exchanged   for a 6 French
vascular sheath, through which a 7 mm x 4 cm Conquest angioplasty
balloon was advanced to the level of a tandem outflow vein stenoses
for venous angioplasty using 60 second  overlapping inflations at
30 atmospheres. After follow-up fistulography, the catheter,
sheath, and guidewire were removed and hemostasis achieved with a 2-
0 Ethilon purse-string suture. No immediate complication.
FINDINGS: The fistula between the brachial artery and outflow
cephalic vein remains widely patent.  There are two tandem high-
grade stenoses in the outflow vein just central to the level of the
fistula.  The more central aspect of the outflow cephalic vein is
mildly patulous at the level of the distal humeral shaft, normal in
caliber centrally.  The central venous outflow through the SVC
remains widely patent.  A left subclavian AICD lead is partially
seen.

The tandem outflow vein stenoses responded well to 7 mm balloon
angioplasty.  Follow-up fistulography shows no residual or
recurrent stenosis, extravasation, dissection, or other apparent
complication. The patient tolerated the procedure well.

IMPRESSION

1.  Tandem outflow vein stenoses just central to the level of the
AV fistula, with good response to 7 mm balloon angioplasty.

Access management: Remains approachable for percutaneous
intervention as needed.

## 2012-07-10 ENCOUNTER — Encounter: Payer: Medicare Other | Admitting: *Deleted

## 2012-07-12 ENCOUNTER — Encounter: Payer: Self-pay | Admitting: *Deleted

## 2012-07-31 ENCOUNTER — Telehealth: Payer: Self-pay | Admitting: Internal Medicine

## 2012-07-31 NOTE — Telephone Encounter (Signed)
Spoke with patient's wife who was questioning the charge for technical service and physician interpretation of device check.  She will wait to see what part her new insurance provider will pay for each service.

## 2012-07-31 NOTE — Telephone Encounter (Signed)
Pt was charged for tech to read and provider reading and she wants to know who read it and get this straight

## 2012-09-18 ENCOUNTER — Other Ambulatory Visit: Payer: Self-pay | Admitting: Nephrology

## 2012-09-18 ENCOUNTER — Ambulatory Visit
Admission: RE | Admit: 2012-09-18 | Discharge: 2012-09-18 | Disposition: A | Discharge status: Home / Self Care | Payer: 59 | Source: Ambulatory Visit | Attending: Nephrology | Admitting: Nephrology

## 2012-09-18 DIAGNOSIS — R7611 Nonspecific reaction to tuberculin skin test without active tuberculosis: Secondary | ICD-10-CM

## 2012-09-22 ENCOUNTER — Telehealth: Payer: Self-pay | Admitting: Internal Medicine

## 2012-09-22 NOTE — Telephone Encounter (Signed)
09-22-12 lmm @ 937am for pt to send on past due remote check or call office to set up defib check with device clinic/mt

## 2012-09-26 ENCOUNTER — Encounter: Payer: Self-pay | Admitting: Internal Medicine

## 2012-10-19 ENCOUNTER — Encounter: Payer: Self-pay | Admitting: Internal Medicine

## 2012-11-20 ENCOUNTER — Encounter: Payer: Self-pay | Admitting: Internal Medicine

## 2012-11-20 ENCOUNTER — Telehealth: Payer: Self-pay | Admitting: Internal Medicine

## 2012-11-20 NOTE — Telephone Encounter (Signed)
10-19-12 past due letter sent, pt has appt in august with Graciela Husbands, needs device clinic check/mt 11-20-12 will send past due certified letter/mt

## 2012-11-23 ENCOUNTER — Ambulatory Visit (INDEPENDENT_AMBULATORY_CARE_PROVIDER_SITE_OTHER): Payer: 59 | Admitting: *Deleted

## 2012-11-23 ENCOUNTER — Encounter: Payer: Self-pay | Admitting: Internal Medicine

## 2012-11-23 DIAGNOSIS — Z95810 Presence of automatic (implantable) cardiac defibrillator: Secondary | ICD-10-CM

## 2012-11-23 DIAGNOSIS — I2589 Other forms of chronic ischemic heart disease: Secondary | ICD-10-CM

## 2012-11-23 NOTE — Telephone Encounter (Signed)
Transmission received, left message for patient. 

## 2012-11-23 NOTE — Telephone Encounter (Signed)
New Prob     Pt wanted to let office know he transmitted and would like to know if it went through.

## 2012-11-27 LAB — REMOTE ICD DEVICE
BATTERY VOLTAGE: 2.55 V
BRDY-0002RV: 40 {beats}/min
DEV-0020ICD: NEGATIVE
DEVICE MODEL ICD: 263295
TZAT-0001SLOWVT: 1
TZAT-0004SLOWVT: 8
TZAT-0012FASTVT: 200 ms
TZAT-0013FASTVT: 1
TZAT-0019SLOWVT: 7.5 V
TZON-0003FASTVT: 285 ms
TZON-0004SLOWVT: 12
TZON-0010SLOWVT: 80 ms
TZST-0001FASTVT: 3
TZST-0001FASTVT: 5
TZST-0001SLOWVT: 3
TZST-0001SLOWVT: 4
TZST-0003FASTVT: 36 J
TZST-0003FASTVT: 36 J
TZST-0003SLOWVT: 15 J
TZST-0003SLOWVT: 36 J

## 2012-12-07 ENCOUNTER — Telehealth: Payer: Self-pay | Admitting: Internal Medicine

## 2012-12-07 NOTE — Telephone Encounter (Signed)
Certified letter sent 11-20-12, signed and rtn to office 12-06-12, has appt 01-09-13, but needed device check before then, battery low/mt

## 2012-12-19 ENCOUNTER — Encounter: Payer: Self-pay | Admitting: *Deleted

## 2013-01-09 ENCOUNTER — Encounter: Payer: Self-pay | Admitting: Cardiology

## 2013-01-09 ENCOUNTER — Ambulatory Visit (INDEPENDENT_AMBULATORY_CARE_PROVIDER_SITE_OTHER): Payer: 59 | Admitting: Internal Medicine

## 2013-01-09 ENCOUNTER — Ambulatory Visit (INDEPENDENT_AMBULATORY_CARE_PROVIDER_SITE_OTHER): Payer: 59 | Admitting: Cardiology

## 2013-01-09 ENCOUNTER — Encounter: Payer: Self-pay | Admitting: Internal Medicine

## 2013-01-09 VITALS — BP 106/64 | HR 72 | Ht 67.0 in | Wt 142.1 lb

## 2013-01-09 VITALS — BP 120/66 | HR 68 | Ht 66.0 in | Wt 142.0 lb

## 2013-01-09 DIAGNOSIS — I10 Essential (primary) hypertension: Secondary | ICD-10-CM

## 2013-01-09 DIAGNOSIS — Z95810 Presence of automatic (implantable) cardiac defibrillator: Secondary | ICD-10-CM

## 2013-01-09 DIAGNOSIS — I2589 Other forms of chronic ischemic heart disease: Secondary | ICD-10-CM

## 2013-01-09 DIAGNOSIS — I25810 Atherosclerosis of coronary artery bypass graft(s) without angina pectoris: Secondary | ICD-10-CM

## 2013-01-09 LAB — ICD DEVICE OBSERVATION
DEV-0020ICD: NEGATIVE
DEVICE MODEL ICD: 263295
RV LEAD AMPLITUDE: 12 mv
RV LEAD THRESHOLD: 0.75 V
TOT-0008: 0
TOT-0009: 2
TOT-0010: 57

## 2013-01-09 NOTE — Progress Notes (Signed)
Patient Care Team: Cala Bradford, MD as PCP - General (Family Medicine)   HPI  [name] [name] is a 72 y.o. male seen in followup for an ICD implanted for primary prevention of ischemic heart disease. He is s/p CABG. He as ESRD on HD  He has had no shortness of breath, PND or orthopnea. He has had no weight gain or edema.    Past Medical History  Diagnosis Date  . Cancer     personal history of prostate cancer  . Hypertension     benign essential  . Gout   . Coronary artery disease   . Ischemic cardiomyopathy   . CKD (chronic kidney disease)     dialysis dependant  . Diabetes mellitus   . Dyslipidemia     Past Surgical History  Procedure Laterality Date  . Icd      placed on November 05, 2004, [employer] single-chamber Atlas [phone]  . Coronary artery bypass graft  2006    LIMA to the LAD, vein graft to the first OM, vein graft to the PDA, and vein graft to the diagonal  . Mitral valve repair      with a #28 annuloplasty ring  . Dialysis fistula creation    . Dg angio av shunt*r*    . Sp av dialysis shunt intro needle *r*      Current Outpatient Prescriptions  Medication Sig Dispense Refill  . allopurinol (ZYLOPRIM) 100 MG tablet Take 100 mg by mouth as needed.        Marland Kitchen amLODipine (NORVASC) 10 MG tablet Take 10 mg by mouth daily.      Marland Kitchen aspirin 81 MG tablet Take 81 mg by mouth daily.        . B Complex-C-Folic Acid (DIALYVITE PO) Take by mouth daily.      . calcium acetate (PHOSLO) 667 MG capsule Take 1,334 mg by mouth 3 (three) times daily.        . cinacalcet (SENSIPAR) 30 MG tablet Take 30 mg by mouth daily.      . colchicine 0.6 MG tablet Take 0.6 mg by mouth as needed.        . Doxercalciferol (HECTOROL IV) Inject 1 mg into the vein 3 (three) times a week.      Marland Kitchen glipiZIDE (GLUCOTROL XL) 2.5 MG 24 hr tablet Take 2.5 mg by mouth daily.        Marland Kitchen lanthanum (FOSRENOL) 1000 MG chewable tablet Chew 1,000 mg by mouth daily.        . metoprolol (TOPROL-XL) 50 MG 24 hr tablet  Take 50 mg by mouth at bedtime.       . pravastatin (PRAVACHOL) 20 MG tablet Take 20 mg by mouth daily.         No current facility-administered medications for this visit.    No Known Allergies  Review of Systems negative except from HPI and PMH  Physical Exam BP 120/66  Pulse 68  Ht 5\' 6"  (1.676 m)  Wt 142 lb (64.411 kg)  BMI 22.93 kg/m2 Well developed and well nourished in no acute distress HENT normal E scleral and icterus clear Neck Supple JVP flat; carotids brisk and full Clear to ausculation  Regular rate and rhythm, no murmurs gallops or rub; bruit at the right upper sternal border Soft with active bowel sounds No clubbing cyanosis none Edema Alert and oriented, grossly normal motor and sensory function Skin Warm and Dry  ECG demonstrates sinus rhythm at 72  Assessment and  Plan

## 2013-01-09 NOTE — Patient Instructions (Signed)
Remote monitoring is used to monitor your Pacemaker of ICD from home. This monitoring reduces the number of office visits required to check your device to one time per year. It allows [place] to keep an eye on the functioning of your device to ensure it is working properly. You are scheduled for a device check from home on 04/16/13. You may send your transmission at any time that day. If you have a wireless device, the transmission will be sent automatically. After your physician reviews your transmission, you will receive a postcard with your next transmission date.  Your physician wants you to follow-up in: 1 year with Dr. Graciela Husbands. You will receive a reminder letter in the mail two months in advance. If you don't receive a letter, please call our office to schedule the follow-up appointment.  Your physician recommends that you continue on your current medications as directed. Please refer to the Current Medication list given to you today.

## 2013-01-09 NOTE — Assessment & Plan Note (Signed)
Stable on current medication.  

## 2013-01-09 NOTE — Patient Instructions (Addendum)
The current medical regimen is effective;  continue present plan and medications.  Follow up in 1 year with Dr Antoine Poche.  You will receive a letter in the mail 2 months before you are due.  Please call us when you receive this letter to schedule your follow up appointment.  Thanks for signing up for MyChart!  MyChart Username: _____________________  MyChart Password: _____________________

## 2013-01-09 NOTE — Assessment & Plan Note (Signed)
The patient has an ICD approaching ERI. We had a brief discussion related to whether or not he would like to have it changed out at which time it reaches ERI. He is at this point disinclined to pursue device generator replacement. I told him next year we needed we can review the data related to device generator replacement and at that point he would make whatever decision he is most comfortable.

## 2013-01-09 NOTE — Progress Notes (Signed)
HPI The patient returned for one year followup. Since I last saw him he has done well with no new cardiovascular events. He denies any chest pressure, neck or arm discomfort. He has had no palpitations, presyncope or syncope. He has had no PND or orthopnea. He gets no weight gain or edema. He tolerates dialysis. It sounds like she is quite sedentary and not exercising at all.  No Known Allergies  Current Outpatient Prescriptions  Medication Sig Dispense Refill  . allopurinol (ZYLOPRIM) 100 MG tablet Take 100 mg by mouth as needed.        Marland Kitchen amLODipine (NORVASC) 10 MG tablet Take 10 mg by mouth daily.      Marland Kitchen aspirin 81 MG tablet Take 81 mg by mouth daily.        . B Complex-C-Folic Acid (DIALYVITE PO) Take by mouth daily.      . calcium acetate (PHOSLO) 667 MG capsule Take 1,334 mg by mouth 3 (three) times daily.        . cinacalcet (SENSIPAR) 30 MG tablet Take 30 mg by mouth daily.      . colchicine 0.6 MG tablet Take 0.6 mg by mouth as needed.        . Doxercalciferol (HECTOROL IV) Inject 1 mg into the vein 3 (three) times a week.      Marland Kitchen glipiZIDE (GLUCOTROL XL) 2.5 MG 24 hr tablet Take 2.5 mg by mouth daily.        Marland Kitchen lanthanum (FOSRENOL) 1000 MG chewable tablet Chew 1,000 mg by mouth daily.        . metoprolol (TOPROL-XL) 50 MG 24 hr tablet Take 50 mg by mouth at bedtime.       . pravastatin (PRAVACHOL) 20 MG tablet Take 20 mg by mouth daily.         No current facility-administered medications for this visit.    Past Medical History  Diagnosis Date  . Cancer     personal history of prostate cancer  . Hypertension     benign essential  . Gout   . Coronary artery disease   . Ischemic cardiomyopathy   . CKD (chronic kidney disease)     dialysis dependant  . Diabetes mellitus   . Dyslipidemia     Past Surgical History  Procedure Laterality Date  . Icd      placed on November 05, 2004, [employer] single-chamber Atlas [phone]  . Coronary artery bypass graft  2006    LIMA to the LAD,  vein graft to the first OM, vein graft to the PDA, and vein graft to the diagonal  . Mitral valve repair      with a #28 annuloplasty ring  . Dialysis fistula creation    . Dg angio av shunt*r*    . Sp av dialysis shunt intro needle *r*      ROS:  As stated in the HPI and negative for all other systems.  PHYSICAL EXAM BP 106/64  Pulse 72  Ht 5\' 7"  (1.702 m)  Wt 142 lb 1.9 oz (64.465 kg)  BMI 22.25 kg/m2 GENERAL:  Well appearing HEENT:  Pupils equal round and reactive, fundi not visualized, oral mucosa unremarkable NECK:  No jugular venous distention, waveform within normal limits, carotid upstroke brisk and symmetric, no bruits, no thyromegaly LUNGS:  Clear to auscultation bilaterally BACK:  No CVA tenderness CHEST:  Unremarkable HEART:  PMI not displaced or sustained,S1 and S2 within normal limits, no S3, no S4, no clicks,  no rubs, apical right upper sternal border systolic early murmur, no diastolic murmurs, thrill right upper chest ABD:  Flat, positive bowel sounds normal in frequency in pitch, no bruits, no rebound, no guarding, no midline pulsatile mass, no hepatomegaly, no splenomegaly EXT:  2 plus pulses upper, right upper arm dialysis fistula , no edema, no cyanosis no clubbing, absent DP, PT left leg   EKG: Sinus rhythm, rate 72, QTC prolonged, lateral T wave inversions unchanged from previous.  01/09/2013  ASSESSMENT AND PLAN  CAD:  He has no active ischemic symptoms. He will continue with risk reduction. I did review his A1c which was 6.2 recently. He will get a lipid profile and have that result faxed to me.  ISCHEMIC CARDIOMYOPATHY:  I reviewed her last echocardiogram from 2011. I would have no reason to suspect further change in his EF. It was moderately reduced then.  He will continue on therapy as listed.  He is going to get his ICD checked today.  HTN:   His blood pressure is well controlled. He will continue on the meds as listed.  CKD:  He tolerates dialysis  without problems. No change in therapy is indicated.

## 2013-04-16 ENCOUNTER — Other Ambulatory Visit: Payer: Self-pay | Admitting: Internal Medicine

## 2013-04-16 ENCOUNTER — Ambulatory Visit (INDEPENDENT_AMBULATORY_CARE_PROVIDER_SITE_OTHER): Payer: 59 | Admitting: *Deleted

## 2013-04-16 DIAGNOSIS — I428 Other cardiomyopathies: Secondary | ICD-10-CM

## 2013-04-20 LAB — MDC_IDC_ENUM_SESS_TYPE_REMOTE
Battery Voltage: 2.55 V
Date Time Interrogation Session: 20141124205406
HighPow Impedance: 30 Ohm
HighPow Impedance: 33 Ohm
Lead Channel Impedance Value: 420 Ohm
Lead Channel Sensing Intrinsic Amplitude: 12 mV
Lead Channel Setting Pacing Amplitude: 2.5 V
Lead Channel Setting Sensing Sensitivity: 0.3 mV
Zone Setting Detection Interval: 285 ms
Zone Setting Detection Interval: 350 ms

## 2013-05-02 ENCOUNTER — Encounter: Payer: Self-pay | Admitting: *Deleted

## 2013-05-07 ENCOUNTER — Encounter: Payer: Self-pay | Admitting: Internal Medicine

## 2013-05-21 ENCOUNTER — Encounter: Payer: 59 | Admitting: *Deleted

## 2013-05-30 ENCOUNTER — Encounter: Payer: Self-pay | Admitting: *Deleted

## 2014-06-27 ENCOUNTER — Ambulatory Visit (INDEPENDENT_AMBULATORY_CARE_PROVIDER_SITE_OTHER): Payer: Medicare Other | Admitting: Cardiology

## 2014-06-27 ENCOUNTER — Encounter: Payer: Self-pay | Admitting: Cardiology

## 2014-06-27 VITALS — BP 120/70 | HR 74 | Ht 66.0 in | Wt 145.2 lb

## 2014-06-27 DIAGNOSIS — I429 Cardiomyopathy, unspecified: Secondary | ICD-10-CM

## 2014-06-27 NOTE — Patient Instructions (Signed)
Your physician recommends that you schedule a follow-up appointment in: one year with Dr. Percival Spanish  We have ordered an echo for you to get done

## 2014-06-27 NOTE — Progress Notes (Signed)
HPI The patient returned for follow up of CAD and cardiomyopathy.  He is overdue for follow up.  He's had some problems with dialysis recently with his blood pressure dropping. His nephrologist wondered if his ejection fraction had changed. His dry weight was reduced and he says since this happened recently is done better. He's not had any presyncope or syncope. He says he otherwise feels okay.   He has no new cardiovascular events. He denies any chest pressure, neck or arm discomfort. He has had no palpitations, presyncope or syncope. He has had no PND or orthopnea. He gets no weight gain or edema.  It sounds like she is quite sedentary and not exercising at all.  No Known Allergies  Current Outpatient Prescriptions  Medication Sig Dispense Refill  . allopurinol (ZYLOPRIM) 100 MG tablet Take 100 mg by mouth as needed.      Marland Kitchen aspirin 81 MG tablet Take 81 mg by mouth daily.      . B Complex-C-Folic Acid (DIALYVITE PO) Take by mouth daily.    . calcium acetate (PHOSLO) 667 MG capsule Take 1,334 mg by mouth 3 (three) times daily.      . cinacalcet (SENSIPAR) 30 MG tablet Take 30 mg by mouth daily.    . colchicine 0.6 MG tablet Take 0.6 mg by mouth as needed.      . Doxercalciferol (HECTOROL IV) Inject 1 mg into the vein 3 (three) times a week.    Marland Kitchen glipiZIDE (GLUCOTROL XL) 2.5 MG 24 hr tablet Take 2.5 mg by mouth daily.      Marland Kitchen lanthanum (FOSRENOL) 1000 MG chewable tablet Chew 1,000 mg by mouth daily.      . metoprolol (TOPROL-XL) 50 MG 24 hr tablet Take 50 mg by mouth at bedtime.     . pravastatin (PRAVACHOL) 20 MG tablet Take 20 mg by mouth daily.       No current facility-administered medications for this visit.    Past Medical History  Diagnosis Date  . Cancer     personal history of prostate cancer  . Hypertension     benign essential  . Gout   . Coronary artery disease   . Ischemic cardiomyopathy   . CKD (chronic kidney disease)     dialysis dependant  . Diabetes mellitus   .  Dyslipidemia     Past Surgical History  Procedure Laterality Date  . Icd      placed on November 05, 2004, [place] single-chamber Atlas [phone]  . Coronary artery bypass graft  2006    LIMA to the LAD, vein graft to the first OM, vein graft to the PDA, and vein graft to the diagonal  . Mitral valve repair      with a #28 annuloplasty ring  . Dialysis fistula creation    . Dg angio av shunt*r*    . Sp av dialysis shunt intro needle *r*      ROS:  As stated in the HPI and negative for all other systems.  PHYSICAL EXAM BP 120/70 mmHg  Pulse 74  Ht 5\' 6"  (1.676 m)  Wt 145 lb 3.2 oz (65.862 kg)  BMI 23.45 kg/m2 GENERAL:  Well appearing HEENT:  Pupils equal round and reactive, fundi not visualized, oral mucosa unremarkable NECK:  No jugular venous distention, waveform within normal limits, carotid upstroke brisk and symmetric, no bruits, no thyromegaly LUNGS:  Clear to auscultation bilaterally BACK:  No CVA tenderness CHEST:  Unremarkable HEART:  PMI not  displaced or sustained,S1 and S2 within normal limits, no S3, no S4, no clicks, no rubs, apical right upper sternal border systolic early murmur, no diastolic murmurs, thrill right upper chest ABD:  Flat, positive bowel sounds normal in frequency in pitch, no bruits, no rebound, no guarding, no midline pulsatile mass, no hepatomegaly, no splenomegaly EXT:  2 plus pulses upper, right upper arm dialysis fistula very prominent, no edema, no cyanosis no clubbing, absent DP, PT left leg   EKG: Sinus rhythm, rate 74, QTC prolonged, lateral T wave inversions unchanged from previous.  06/27/2014  ASSESSMENT AND PLAN  CAD:  He has no active ischemic symptoms. He will continue with risk reduction.   ISCHEMIC CARDIOMYOPATHY:  We will check an echocardiogram has been about 4 years. He's had a moderate to mildly reduced ejection fraction most recently.  HTN:   His blood pressure is running low.  But with a reduction in his dry weight at dialysis it  seems to be improved and I would like to try to continue him on the same medical regimen for his cardiomyopathy.  CKD:  He will continue per renal.

## 2014-07-08 ENCOUNTER — Encounter: Payer: Self-pay | Admitting: Internal Medicine

## 2014-07-08 ENCOUNTER — Encounter: Payer: Self-pay | Admitting: Cardiology

## 2014-07-09 ENCOUNTER — Institutional Professional Consult (permissible substitution): Payer: Self-pay | Admitting: Pulmonary Disease

## 2014-07-11 ENCOUNTER — Ambulatory Visit (HOSPITAL_COMMUNITY)
Admission: RE | Admit: 2014-07-11 | Discharge: 2014-07-11 | Disposition: A | Discharge status: Home / Self Care | Payer: Medicare Other | Source: Ambulatory Visit | Attending: Cardiology | Admitting: Cardiology

## 2014-07-11 DIAGNOSIS — I428 Other cardiomyopathies: Secondary | ICD-10-CM | POA: Insufficient documentation

## 2014-07-11 DIAGNOSIS — E785 Hyperlipidemia, unspecified: Secondary | ICD-10-CM | POA: Diagnosis not present

## 2014-07-11 DIAGNOSIS — I10 Essential (primary) hypertension: Secondary | ICD-10-CM | POA: Insufficient documentation

## 2014-07-11 DIAGNOSIS — I429 Cardiomyopathy, unspecified: Secondary | ICD-10-CM

## 2014-07-11 DIAGNOSIS — I2510 Atherosclerotic heart disease of native coronary artery without angina pectoris: Secondary | ICD-10-CM

## 2014-07-11 NOTE — Progress Notes (Signed)
2D Echo Performed 07/11/2014    [name] [name], RCS

## 2014-07-25 ENCOUNTER — Ambulatory Visit: Payer: Self-pay | Admitting: Cardiology

## 2015-07-17 ENCOUNTER — Ambulatory Visit (INDEPENDENT_AMBULATORY_CARE_PROVIDER_SITE_OTHER): Payer: Medicare Other | Admitting: Cardiology

## 2015-07-17 ENCOUNTER — Encounter: Payer: Self-pay | Admitting: Cardiology

## 2015-07-17 VITALS — BP 122/60 | HR 76 | Ht 66.0 in | Wt 144.5 lb

## 2015-07-17 DIAGNOSIS — I25810 Atherosclerosis of coronary artery bypass graft(s) without angina pectoris: Secondary | ICD-10-CM

## 2015-07-17 NOTE — Progress Notes (Signed)
HPI The patient returned for follow up of CAD and cardiomyopathy.  Since I last saw him he has done well.  He denies any cardiovascular symptoms.  He's not had any presyncope or syncope. He says he otherwise feels okay.   He has no new cardiovascular events. He denies any chest pressure, neck or arm discomfort. He has had no palpitations, presyncope or syncope. He has had no PND or orthopnea. He gets no weight gain or edema.  He does little activity per his wife. .  Last year the EF was about the same at 30 - 35% with stable MV repair.    No Known Allergies  Current Outpatient Prescriptions  Medication Sig Dispense Refill  . allopurinol (ZYLOPRIM) 100 MG tablet Take 100 mg by mouth as needed.      Marland Kitchen aspirin 81 MG tablet Take 81 mg by mouth daily.      . B Complex-C-Folic Acid (DIALYVITE PO) Take by mouth daily.    . calcium acetate (PHOSLO) 667 MG capsule Take 1,334 mg by mouth 3 (three) times daily.      . cinacalcet (SENSIPAR) 30 MG tablet Take 30 mg by mouth daily.    . colchicine 0.6 MG tablet Take 0.6 mg by mouth as needed.      . Doxercalciferol (HECTOROL IV) Inject 1 mg into the vein 3 (three) times a week.    Marland Kitchen glipiZIDE (GLUCOTROL XL) 2.5 MG 24 hr tablet Take 2.5 mg by mouth daily.      Marland Kitchen lanthanum (FOSRENOL) 1000 MG chewable tablet Chew 1,000 mg by mouth daily.      . metoprolol (TOPROL-XL) 50 MG 24 hr tablet Take 50 mg by mouth at bedtime.     . pravastatin (PRAVACHOL) 20 MG tablet Take 20 mg by mouth daily.      Marland Kitchen lidocaine-prilocaine (EMLA) cream 3 (three) times a week.  12   No current facility-administered medications for this visit.    Past Medical History  Diagnosis Date  . Cancer [facility])     personal history of prostate cancer  . Hypertension     benign essential  . Gout   . Coronary artery disease   . Ischemic cardiomyopathy   . CKD (chronic kidney disease)     dialysis dependant  . Diabetes mellitus   . Dyslipidemia     Past Surgical History  Procedure  Laterality Date  . Icd      placed on November 05, 2004, [place] single-chamber Atlas [phone]  . Coronary artery bypass graft  2006    LIMA to the LAD, vein graft to the first OM, vein graft to the PDA, and vein graft to the diagonal  . Mitral valve repair      with a #28 annuloplasty ring  . Dialysis fistula creation    . Dg angio av shunt*r*    . Sp av dialysis shunt intro needle *r*      ROS:  As stated in the HPI and negative for all other systems.  PHYSICAL EXAM BP 122/60 mmHg  Pulse 76  Ht 5\' 6"  (1.676 m)  Wt 144 lb 8 oz (65.545 kg)  BMI 23.33 kg/m2 GENERAL:  Well appearing HEENT:  Pupils equal round and reactive, fundi not visualized, oral mucosa unremarkable NECK:  No jugular venous distention, waveform within normal limits, carotid upstroke brisk and symmetric, no bruits, no thyromegaly LUNGS:  Clear to auscultation bilaterally BACK:  No CVA tenderness CHEST:  Unremarkable HEART:  PMI  not displaced or sustained,S1 and S2 within normal limits, no S3, no S4, no clicks, no rubs, apical right upper sternal border systolic early murmur, no diastolic murmurs, thrill right upper chest ABD:  Flat, positive bowel sounds normal in frequency in pitch, no bruits, no rebound, no guarding, no midline pulsatile mass, no hepatomegaly, no splenomegaly EXT:  2 plus pulses upper, right upper arm dialysis fistula very prominent, no edema, no cyanosis no clubbing, absent DP, PT left leg   EKG: Sinus rhythm, rate 76, QTC prolonged, lateral T wave inversions unchanged from previous.  07/17/2015  ASSESSMENT AND PLAN  CAD:  He has no active ischemic symptoms. He will continue with risk reduction.   ISCHEMIC CARDIOMYOPATHY:  He is euvolemic through dialysis.  No change in therapy is planed.   HTN:   His blood pressure is stable and I reviewed a BP diary.  No change in therapy.    CKD:  He will continue per renal.   No results found for: CHOL, TRIG, HDL, LDLCALC, LDLDIRECT

## 2015-07-17 NOTE — Patient Instructions (Signed)
Dr Percival Spanish recommends that you continue on your current medications as directed. Please refer to the Current Medication list given to you today.  Dr Percival Spanish recommends that you schedule a follow-up appointment in 1 year. You will receive a reminder letter in the mail two months in advance. If you don't receive a letter, please call our office to schedule the follow-up appointment.

## 2016-03-04 ENCOUNTER — Other Ambulatory Visit: Payer: Self-pay | Admitting: Family Medicine

## 2016-03-04 DIAGNOSIS — R0989 Other specified symptoms and signs involving the circulatory and respiratory systems: Secondary | ICD-10-CM

## 2016-03-16 ENCOUNTER — Ambulatory Visit
Admission: RE | Admit: 2016-03-16 | Discharge: 2016-03-16 | Disposition: A | Discharge status: Home / Self Care | Payer: Medicare Other | Source: Ambulatory Visit | Attending: Family Medicine | Admitting: Family Medicine

## 2016-03-16 DIAGNOSIS — R0989 Other specified symptoms and signs involving the circulatory and respiratory systems: Secondary | ICD-10-CM

## 2016-08-27 ENCOUNTER — Telehealth: Payer: Self-pay | Admitting: Cardiology

## 2016-08-27 NOTE — Telephone Encounter (Signed)
Returned call to patient's wife. Hospice nurse has visited patient today. They have taken patient off dialysis. Wife wanted Dr. Percival Spanish to know patient's current health status. Will route to MD.

## 2016-08-27 NOTE — Telephone Encounter (Signed)
New message  Pt wife call requesting to speak with RN. Pt wife states pt has taken himself off of dialysis and are now in a transitional period. Please call back to discuss

## 2016-09-01 ENCOUNTER — Encounter (HOSPITAL_COMMUNITY): Payer: Self-pay

## 2016-09-01 ENCOUNTER — Telehealth: Payer: Self-pay

## 2016-09-01 ENCOUNTER — Emergency Department (HOSPITAL_COMMUNITY)
Admission: EM | Admit: 2016-09-01 | Discharge: 2016-09-01 | Disposition: A | Discharge status: Home / Self Care | Attending: Emergency Medicine | Admitting: Emergency Medicine

## 2016-09-01 ENCOUNTER — Telehealth: Payer: Self-pay | Admitting: Cardiology

## 2016-09-01 DIAGNOSIS — I129 Hypertensive chronic kidney disease with stage 1 through stage 4 chronic kidney disease, or unspecified chronic kidney disease: Secondary | ICD-10-CM | POA: Insufficient documentation

## 2016-09-01 DIAGNOSIS — T82198A Other mechanical complication of other cardiac electronic device, initial encounter: Secondary | ICD-10-CM | POA: Diagnosis not present

## 2016-09-01 DIAGNOSIS — Z8546 Personal history of malignant neoplasm of prostate: Secondary | ICD-10-CM | POA: Diagnosis not present

## 2016-09-01 DIAGNOSIS — T82897A Other specified complication of cardiac prosthetic devices, implants and grafts, initial encounter: Secondary | ICD-10-CM | POA: Diagnosis present

## 2016-09-01 DIAGNOSIS — Z87891 Personal history of nicotine dependence: Secondary | ICD-10-CM | POA: Diagnosis not present

## 2016-09-01 DIAGNOSIS — N189 Chronic kidney disease, unspecified: Secondary | ICD-10-CM | POA: Diagnosis not present

## 2016-09-01 DIAGNOSIS — Z7982 Long term (current) use of aspirin: Secondary | ICD-10-CM | POA: Insufficient documentation

## 2016-09-01 DIAGNOSIS — Z79899 Other long term (current) drug therapy: Secondary | ICD-10-CM | POA: Diagnosis not present

## 2016-09-01 DIAGNOSIS — I2510 Atherosclerotic heart disease of native coronary artery without angina pectoris: Secondary | ICD-10-CM | POA: Diagnosis not present

## 2016-09-01 DIAGNOSIS — Y829 Unspecified medical devices associated with adverse incidents: Secondary | ICD-10-CM | POA: Diagnosis not present

## 2016-09-01 DIAGNOSIS — E1122 Type 2 diabetes mellitus with diabetic chronic kidney disease: Secondary | ICD-10-CM | POA: Diagnosis not present

## 2016-09-01 DIAGNOSIS — Z4502 Encounter for adjustment and management of automatic implantable cardiac defibrillator: Secondary | ICD-10-CM

## 2016-09-01 DIAGNOSIS — Z951 Presence of aortocoronary bypass graft: Secondary | ICD-10-CM | POA: Diagnosis not present

## 2016-09-01 MED ORDER — MORPHINE SULFATE (CONCENTRATE) 10 MG/0.5ML PO SOLN: 20.0000 mg | Freq: Once | ORAL | Status: DC

## 2016-09-01 MED ORDER — MORPHINE SULFATE (CONCENTRATE) 10 MG/0.5ML PO SOLN
10.0000 mg | Freq: Once | ORAL | Status: AC
  Administered 2016-09-01: 10 mg via ORAL

## 2016-09-01 NOTE — ED Notes (Signed)
Pt and family verbalized understanding of discharge instructions and have no further questions. Pt alert and oriented. VSS NAD

## 2016-09-01 NOTE — ED Triage Notes (Signed)
Pt from [facility] care with ems. Pt complains that his defibrillator has fired 4 times within the last hour. Pt wants the defibrillator turned off. Pt was a dialysis pt and has recently discontinued his treatments two weeks ago. Pt denies any CP, VSS, nad at this time. Pt alert. 138/80 HR 96 98% on room air.

## 2016-09-01 NOTE — Consult Note (Signed)
ELECTROPHYSIOLOGY CONSULT NOTE    Patient ID: [name] [name] MRN: [id number], DOB/AGE: December 26, 1940 76 y.o.  Admit date: 09/01/2016 Date of Consult: 09/01/2016   Primary Physician: Vidal Schwalbe, MD Primary Cardiologist: Hochrein Electrophysiologist: Caryl Comes  Reason for Consultation: turn ICD off for hospice care   HPI:  [name] [name] is a 76 y.o. male is referred by ER MD for evaluation of turning off ICD therapies at end of life.  His past medical history is significant for ESRD (pt has chosen to stop HD), hypertension, ICM, and chronic systolic heart failure. He has not been seen by EP in several years.  He was last seen by Dr Percival Spanish 06/2015.  He has had a decline since then and has decided to be treated in hospice care.  His ICD was not deactivated when he was placed in hospice.  He had 4 ICD shocks today and device interrogation demonstrated inappropriate device therapy for TWOS 2/2 likely hyperkalemia. EP has been asked to deactivate tachy therapies on ICD.  He is currently short of breath at rest and is at peace with his decision to turn device off.  Long discussion by EDP with patient and family today.    Past Medical History:  Diagnosis Date  . Cancer [facility])    personal history of prostate cancer  . CKD (chronic kidney disease)    dialysis dependant  . Coronary artery disease   . Diabetes mellitus   . Dyslipidemia   . Gout   . Hypertension    benign essential  . Ischemic cardiomyopathy      Surgical History:  Past Surgical History:  Procedure Laterality Date  . CORONARY ARTERY BYPASS GRAFT  2006   LIMA to the LAD, vein graft to the first OM, vein graft to the PDA, and vein graft to the diagonal  . DG ANGIO AV SHUNT*R*    . DIALYSIS FISTULA CREATION    . ICD     placed on November 05, 2004, [place] single-chamber Atlas [phone]  . MITRAL VALVE REPAIR     with a #28 annuloplasty ring  . SP AV DIALYSIS SHUNT INTRO NEEDLE *R*        (Not in a hospital  admission)  Inpatient Medications: . morphine CONCENTRATE  20 mg Oral Once    Allergies: No Known Allergies  Social History   Social History  . Marital status: Married    Spouse name: N/A  . Number of children: N/A  . Years of education: N/A   Occupational History  . Not on file.   Social History Main Topics  . Smoking status: Former Smoker    Quit date: 05/24/1985  . Smokeless tobacco: Never Used  . Alcohol use No  . Drug use: No  . Sexual activity: Not on file   Other Topics Concern  . Not on file   Social History Narrative  . No narrative on file     Family History  Problem Relation Age of Onset  . Heart attack Mother   . Hypertension Sister      Review of Systems: All other systems reviewed and are otherwise negative except as noted above.  Physical Exam: Vitals:   09/01/16 1548 09/01/16 1603  BP:  116/86  Pulse:  (!) 110  Resp:  (!) 30  SpO2: 98% 93%    GEN- The patient is acutely ill appearing, alert and oriented x 3 today.   HEENT: normocephalic, atraumatic; sclera clear, conjunctiva pink; hearing intact; oropharynx clear;  neck supple  Lungs- increased work of breathing  Heart- Regular rate and rhythm  GI- soft, non-tender, non-distended  Extremities- no clubbing, cyanosis, or edema  MS- no significant deformity or atrophy Skin- warm and dry, no rash or lesion Psych- euthymic mood, full affect Neuro- strength and sensation are intact  Labs:  Lab Results  Component Value Date   WBC 9.7 12/31/2007   HGB 15.3 05/31/2008   HCT 45.0 05/31/2008   MCV 92.1 12/31/2007   PLT 142 (L) 12/31/2007   No results for input(s): NA, K, CL, CO2, BUN, CREATININE, CALCIUM, PROT, BILITOT, ALKPHOS, ALT, AST, GLUCOSE in the last 168 hours.  Invalid input(s): LABALBU   DEVICE HISTORY: [employer] single chamber ICD implanted 2006 for primary prevention. Device interrogation today demonstrates device at ERI with 4 episodes in the VT zone (TWOS and inappropriate ICD  shocks)  Assessment/Plan: 1.  Inappropriate ICD therapy TWOS likely 2/2 hyperkalemia in the setting of stopping dialysis and hospice care.  He and his family are clear in their decision to turn off ICD therapy which is very appropriate ICD programmed with tachy therapies off today No other changes made  Electrophysiology team to see as needed while here. Please call with questions. Dr Caryl Comes did not see the patient today as patient was in the ER with plan for ongoing hospice care at home.    Signed, Chanetta Marshall, NP 09/01/2016 4:39 PM

## 2016-09-01 NOTE — Telephone Encounter (Signed)
Spoke with patient's daughter who reported that patient has recently stopped dialysis and been on hospice the last couple of weeks. She states that he received 3-4 shocks within the last couple of hours and they are taking the patient to the ED to have therapies turned off. NP and industry rep notified

## 2016-09-01 NOTE — Telephone Encounter (Signed)
New message    Racheal calling regarding pt defib constantly going off and beeping. Requests a call back.    1. Has your device fired? Constantly going off  2. Is you device beeping? yes  3. Are you experiencing draining or swelling at device site? no  4. Are you calling to see if we received your device transmission? no  5. Have you passed out? no

## 2016-09-01 NOTE — ED Notes (Signed)
ED Provider at bedside. 

## 2016-09-01 NOTE — ED Provider Notes (Signed)
[facility] DEPT Provider Note   CSN: [id number] Arrival date & time: 09/01/16  1547     History   Chief Complaint Chief Complaint  Patient presents with  . Defib firing    HPI JAHMIRE RUFFINS is a 76 y.o. male.  The history is provided by the patient.   76 year old male who presents with AICD firing. History of ischemic cardiomyopathy with AICD and ESRD. On Hospice. He states he asked to stop dialysis as he was tired of it. He states that it was his decision and that he was ready to pass on. Has noticed increased fatigue and shortness of breath today. States that he was shocked by AICD 4 times today and would like to have it discontinued.   Past Medical History:  Diagnosis Date  . Cancer [facility])    personal history of prostate cancer  . CKD (chronic kidney disease)    dialysis dependant  . Coronary artery disease   . Diabetes mellitus   . Dyslipidemia   . Gout   . Hypertension    benign essential  . Ischemic cardiomyopathy     Patient Active Problem List   Diagnosis Date Noted  . 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 PVD (peripheral vascular disease) ([place]) 01/21/2011  . DYSLIPIDEMIA 01/13/2010  . ISCHEMIC CARDIOMYOPATHY 01/13/2010  . GOUT 08/30/2008  . HYPERTENSION, BENIGN ESSENTIAL 08/30/2008  . CAD, ARTERY BYPASS GRAFT 08/30/2008  . PROSTATE CANCER-PERSONAL HISTORY 08/30/2008  . ICD-St.Jude 08/30/2008    Past Surgical History:  Procedure Laterality Date  . CORONARY ARTERY BYPASS GRAFT  2006   LIMA to the LAD, vein graft to the first OM, vein graft to the PDA, and vein graft to the diagonal  . DG ANGIO AV SHUNT*R*    . DIALYSIS FISTULA CREATION    . ICD     placed on November 05, 2004, [place] single-chamber Atlas [id number]  . MITRAL VALVE REPAIR     with a #28 annuloplasty ring  . SP AV DIALYSIS SHUNT INTRO NEEDLE *R*         Home Medications    Prior to Admission medications   Medication Sig Start Date End Date  Taking? Authorizing Provider  allopurinol (ZYLOPRIM) 100 MG tablet Take 100 mg by mouth as needed.      Historical Provider, MD  aspirin 81 MG tablet Take 81 mg by mouth daily.      Historical Provider, MD  B Complex-C-Folic Acid (DIALYVITE PO) Take by mouth daily.    Historical Provider, MD  calcium acetate (PHOSLO) 667 MG capsule Take 1,334 mg by mouth 3 (three) times daily.      Historical Provider, MD  cinacalcet (SENSIPAR) 30 MG tablet Take 30 mg by mouth daily.    Historical Provider, MD  colchicine 0.6 MG tablet Take 0.6 mg by mouth as needed.      Historical Provider, MD  Doxercalciferol (HECTOROL IV) Inject 1 mg into the vein 3 (three) times a week.    Historical Provider, MD  glipiZIDE (GLUCOTROL XL) 2.5 MG 24 hr tablet Take 2.5 mg by mouth daily.      Historical Provider, MD  lanthanum (FOSRENOL) 1000 MG chewable tablet Chew 1,000 mg by mouth daily.      Historical Provider, MD  lidocaine-prilocaine (EMLA) cream 3 (three) times a week. 06/26/15   Historical Provider, MD  metoprolol (TOPROL-XL) 50 MG 24 hr tablet Take 50 mg by mouth at bedtime.     Historical  Provider, MD  pravastatin (PRAVACHOL) 20 MG tablet Take 20 mg by mouth daily.      Historical Provider, MD    Family History Family History  Problem Relation Age of Onset  . Heart attack Mother   . Hypertension Sister     Social History Social History  Substance Use Topics  . Smoking status: Former Smoker    Quit date: 05/24/1985  . Smokeless tobacco: Never Used  . Alcohol use No     Allergies   Patient has no known allergies.   Review of Systems Review of Systems  Constitutional: Positive for fatigue.  Respiratory: Positive for shortness of breath.   Cardiovascular: Negative for chest pain.  Gastrointestinal: Positive for abdominal distention.  All other systems reviewed and are negative.    Physical Exam Updated Vital Signs BP 116/86 (BP Location: Left Arm)   Pulse (!) 110   Temp (!) 95.9 F (35.5 C)  (Axillary)   Resp (!) 30   SpO2 93%   Physical Exam Physical Exam  Nursing note and vitals reviewed. Constitutional: Chronically ill appearing, non-toxic, and in mild respiratory distress Head: Normocephalic.  Eyes: Conjunctivae are normal.  Cardiovascular: Tachycardic rate, regular rhythm   Pulmonary/Chest: Effort increased.  Abdominal: He exhibits no distension.  Musculoskeletal: He exhibits no deformity. No edema Neurological: He is alert.  Skin: Skin is warm and dry.  Psychiatric: He has a normal mood and affect. His behavior is normal.  Nursing note and vitals reviewed.    ED Treatments / Results  Labs (all labs ordered are listed, but only abnormal results are displayed) Labs Reviewed - No data to display  EKG  EKG Interpretation None       Radiology No results found.  Procedures Procedures (including critical care time)  Medications Ordered in ED Medications  morphine CONCENTRATE 10 MG/0.5ML oral solution 10 mg (not administered)     Initial Impression / Assessment and Plan / ED Course  I have reviewed the triage vital signs and the nursing notes.  Pertinent labs & imaging results that were available during my care of the patient were reviewed by me and considered in my medical decision making (see chart for details).     Hospice patient requesting AICD to be discontinued. Discussed implications of AICD discontinuation, especially as stopping dialysis 2 weeks ago. He expressed understanding that this will lead to his death and states he has already accepted this. Palliative care present during conversation. AICD discontinued at bedside by cardiology. Patient with peaked t-waves causing inappropriate sensing of extra beat leading to inappropriate shocks.  Patient and family requests to discharged home with continuation of hospice care.   Final Clinical Impressions(s) / ED Diagnoses   Final diagnoses:  AICD discharge    New Prescriptions New  Prescriptions   No medications on file     Forde Dandy, MD 09/01/16 1655

## 2016-09-01 NOTE — Telephone Encounter (Signed)
[id number] 

## 2016-09-21 DEATH — deceased
